# Patient Record
Sex: Male | Born: 1955 | Race: White | Hispanic: No | Marital: Married | Smoking: Light tobacco smoker
Health system: Southern US, Community
[De-identification: ages and names within clinical notes are randomized; demographics above are authoritative.]

## PROBLEM LIST (undated history)

## (undated) DIAGNOSIS — I2510 Atherosclerotic heart disease of native coronary artery without angina pectoris: Secondary | ICD-10-CM

## (undated) DIAGNOSIS — G4733 Obstructive sleep apnea (adult) (pediatric): Secondary | ICD-10-CM

## (undated) DIAGNOSIS — E7800 Pure hypercholesterolemia, unspecified: Secondary | ICD-10-CM

## (undated) DIAGNOSIS — I219 Acute myocardial infarction, unspecified: Secondary | ICD-10-CM

## (undated) DIAGNOSIS — I10 Essential (primary) hypertension: Secondary | ICD-10-CM

## (undated) HISTORY — PX: CARDIAC CATHETERIZATION: SHX172

## (undated) HISTORY — PX: CATARACT EXTRACTION, BILATERAL: SHX1313

## (undated) HISTORY — DX: Obstructive sleep apnea (adult) (pediatric): G47.33

---

## 2004-05-16 ENCOUNTER — Emergency Department (HOSPITAL_COMMUNITY): Admission: EM | Admit: 2004-05-16 | Discharge: 2004-05-16 | Payer: Self-pay | Admitting: Emergency Medicine

## 2005-01-16 ENCOUNTER — Ambulatory Visit: Payer: Self-pay | Admitting: *Deleted

## 2005-02-13 ENCOUNTER — Ambulatory Visit: Payer: Self-pay | Admitting: *Deleted

## 2005-02-13 ENCOUNTER — Ambulatory Visit (HOSPITAL_COMMUNITY): Admission: RE | Admit: 2005-02-13 | Discharge: 2005-02-13 | Payer: Self-pay | Admitting: *Deleted

## 2005-02-17 ENCOUNTER — Ambulatory Visit: Payer: Self-pay | Admitting: Internal Medicine

## 2005-07-16 ENCOUNTER — Ambulatory Visit (HOSPITAL_COMMUNITY): Admission: RE | Admit: 2005-07-16 | Discharge: 2005-07-16 | Payer: Self-pay | Admitting: Family Medicine

## 2009-02-26 ENCOUNTER — Encounter: Payer: Self-pay | Admitting: Cardiology

## 2009-10-13 ENCOUNTER — Encounter: Payer: Self-pay | Admitting: Emergency Medicine

## 2009-10-13 ENCOUNTER — Ambulatory Visit: Payer: Self-pay | Admitting: Cardiovascular Disease

## 2009-10-13 ENCOUNTER — Inpatient Hospital Stay (HOSPITAL_COMMUNITY): Admission: EM | Admit: 2009-10-13 | Discharge: 2009-10-15 | Payer: Self-pay | Admitting: Interventional Cardiology

## 2010-09-21 ENCOUNTER — Encounter: Payer: Self-pay | Admitting: *Deleted

## 2010-11-20 LAB — CBC
HCT: 43.2 % (ref 39.0–52.0)
HCT: 45.3 % (ref 39.0–52.0)
HCT: 46.3 % (ref 39.0–52.0)
HCT: 46.8 % (ref 39.0–52.0)
Hemoglobin: 15 g/dL (ref 13.0–17.0)
Hemoglobin: 15.9 g/dL (ref 13.0–17.0)
Hemoglobin: 16.1 g/dL (ref 13.0–17.0)
Hemoglobin: 16.3 g/dL (ref 13.0–17.0)
MCHC: 34.7 g/dL (ref 30.0–36.0)
MCHC: 34.9 g/dL (ref 30.0–36.0)
MCHC: 34.9 g/dL (ref 30.0–36.0)
MCHC: 35 g/dL (ref 30.0–36.0)
MCV: 89.7 fL (ref 78.0–100.0)
MCV: 91.5 fL (ref 78.0–100.0)
MCV: 91.7 fL (ref 78.0–100.0)
MCV: 91.8 fL (ref 78.0–100.0)
Platelets: 171 10*3/uL (ref 150–400)
Platelets: 184 10*3/uL (ref 150–400)
Platelets: 185 10*3/uL (ref 150–400)
Platelets: 203 10*3/uL (ref 150–400)
RBC: 4.72 MIL/uL (ref 4.22–5.81)
RBC: 4.95 MIL/uL (ref 4.22–5.81)
RBC: 5.04 MIL/uL (ref 4.22–5.81)
RBC: 5.22 MIL/uL (ref 4.22–5.81)
RDW: 13.3 % (ref 11.5–15.5)
RDW: 13.4 % (ref 11.5–15.5)
RDW: 13.6 % (ref 11.5–15.5)
RDW: 13.6 % (ref 11.5–15.5)
WBC: 10.6 10*3/uL — ABNORMAL HIGH (ref 4.0–10.5)
WBC: 8.8 10*3/uL (ref 4.0–10.5)
WBC: 9 10*3/uL (ref 4.0–10.5)
WBC: 9.2 10*3/uL (ref 4.0–10.5)

## 2010-11-20 LAB — COMPREHENSIVE METABOLIC PANEL
ALT: 26 U/L (ref 0–53)
AST: 19 U/L (ref 0–37)
Albumin: 4 g/dL (ref 3.5–5.2)
Alkaline Phosphatase: 46 U/L (ref 39–117)
BUN: 10 mg/dL (ref 6–23)
CO2: 26 mEq/L (ref 19–32)
Calcium: 9.3 mg/dL (ref 8.4–10.5)
Chloride: 104 mEq/L (ref 96–112)
Creatinine, Ser: 1.08 mg/dL (ref 0.4–1.5)
GFR calc Af Amer: >60 mL/min (ref >60)
GFR calc non Af Amer: >60 mL/min (ref >60)
Glucose, Bld: 93 mg/dL (ref 70–99)
Potassium: 4 mEq/L (ref 3.5–5.1)
Sodium: 139 mEq/L (ref 135–145)
Total Bilirubin: 0.7 mg/dL (ref 0.3–1.2)
Total Protein: 7 g/dL (ref 6.0–8.3)

## 2010-11-20 LAB — BASIC METABOLIC PANEL
BUN: 8 mg/dL (ref 6–23)
BUN: 9 mg/dL (ref 6–23)
CO2: 25 mEq/L (ref 19–32)
CO2: 26 mEq/L (ref 19–32)
Calcium: 8.5 mg/dL (ref 8.4–10.5)
Calcium: 8.7 mg/dL (ref 8.4–10.5)
Chloride: 105 mEq/L (ref 96–112)
Chloride: 107 mEq/L (ref 96–112)
Creatinine, Ser: 0.97 mg/dL (ref 0.4–1.5)
Creatinine, Ser: 1.02 mg/dL (ref 0.4–1.5)
GFR calc Af Amer: >60 mL/min (ref >60)
GFR calc Af Amer: >60 mL/min (ref >60)
GFR calc non Af Amer: >60 mL/min (ref >60)
GFR calc non Af Amer: >60 mL/min (ref >60)
Glucose, Bld: 104 mg/dL — ABNORMAL HIGH (ref 70–99)
Glucose, Bld: 90 mg/dL (ref 70–99)
Potassium: 3.6 mEq/L (ref 3.5–5.1)
Potassium: 3.7 mEq/L (ref 3.5–5.1)
Sodium: 135 mEq/L (ref 135–145)
Sodium: 138 mEq/L (ref 135–145)

## 2010-11-20 LAB — CARDIAC PANEL(CRET KIN+CKTOT+MB+TROPI)
CK, MB: 44.1 ng/mL (ref 0.3–4.0)
CK, MB: 62 ng/mL (ref 0.3–4.0)
CK, MB: 68.2 ng/mL (ref 0.3–4.0)
Relative Index: 11.5 — ABNORMAL HIGH (ref 0.0–2.5)
Relative Index: 12.9 — ABNORMAL HIGH (ref 0.0–2.5)
Relative Index: 13.6 — ABNORMAL HIGH (ref 0.0–2.5)
Total CK: 383 U/L — ABNORMAL HIGH (ref 7–232)
Total CK: 481 U/L — ABNORMAL HIGH (ref 7–232)
Total CK: 501 U/L — ABNORMAL HIGH (ref 7–232)
Troponin I: 11.1 ng/mL (ref 0.00–0.06)
Troponin I: 7.53 ng/mL (ref 0.00–0.06)
Troponin I: 9.65 ng/mL (ref 0.00–0.06)

## 2010-11-20 LAB — LIPID PANEL
Cholesterol: 176 mg/dL (ref 0–200)
Cholesterol: 178 mg/dL (ref 0–200)
HDL: 28 mg/dL — ABNORMAL LOW (ref >39)
HDL: 29 mg/dL — ABNORMAL LOW (ref >39)
LDL Cholesterol: 100 mg/dL — ABNORMAL HIGH (ref 0–99)
LDL Cholesterol: 97 mg/dL (ref 0–99)
Total CHOL/HDL Ratio: 6.1 RATIO
Total CHOL/HDL Ratio: 6.4 RATIO
Triglycerides: 248 mg/dL — ABNORMAL HIGH (ref <150)
Triglycerides: 249 mg/dL — ABNORMAL HIGH (ref <150)
VLDL: 50 mg/dL — ABNORMAL HIGH (ref 0–40)
VLDL: 50 mg/dL — ABNORMAL HIGH (ref 0–40)

## 2010-11-20 LAB — DIFFERENTIAL
Basophils Absolute: 0 10*3/uL (ref 0.0–0.1)
Basophils Relative: 0 % (ref 0–1)
Eosinophils Absolute: 0.2 10*3/uL (ref 0.0–0.7)
Eosinophils Relative: 2 % (ref 0–5)
Lymphocytes Relative: 23 % (ref 12–46)
Lymphs Abs: 2 10*3/uL (ref 0.7–4.0)
Monocytes Absolute: 0.5 10*3/uL (ref 0.1–1.0)
Monocytes Relative: 6 % (ref 3–12)
Neutro Abs: 6 10*3/uL (ref 1.7–7.7)
Neutrophils Relative %: 68 % (ref 43–77)

## 2010-11-20 LAB — POCT CARDIAC MARKERS
CKMB, poc: 1.6 ng/mL (ref 1.0–8.0)
CKMB, poc: 9.2 ng/mL (ref 1.0–8.0)
Myoglobin, poc: 171 ng/mL (ref 12–200)
Myoglobin, poc: 234 ng/mL (ref 12–200)
Troponin i, poc: 0.56 ng/mL (ref 0.00–0.09)
Troponin i, poc: <0.05 ng/mL (ref 0.00–0.09)

## 2010-11-20 LAB — PROTIME-INR
INR: 1.03 (ref 0.00–1.49)
Prothrombin Time: 13.4 seconds (ref 11.6–15.2)

## 2010-11-20 LAB — MRSA PCR SCREENING: MRSA by PCR: NEGATIVE

## 2010-11-20 LAB — HEPARIN LEVEL (UNFRACTIONATED)
Heparin Unfractionated: 0.16 IU/mL — ABNORMAL LOW (ref 0.30–0.70)
Heparin Unfractionated: 0.17 IU/mL — ABNORMAL LOW (ref 0.30–0.70)

## 2010-11-20 LAB — D-DIMER, QUANTITATIVE: D-Dimer, Quant: 0.22 ug/mL-FEU (ref 0.00–0.48)

## 2010-11-20 LAB — MAGNESIUM: Magnesium: 1.8 mg/dL (ref 1.5–2.5)

## 2010-11-20 LAB — CK TOTAL AND CKMB (NOT AT ARMC): Relative Index: 4.9 — ABNORMAL HIGH (ref 0.0–2.5)

## 2010-11-20 LAB — LIPASE, BLOOD: Lipase: 37 U/L (ref 11–59)

## 2011-01-16 NOTE — Consult Note (Signed)
NAME:  [name], [name]               ACCOUNT NO.:  [id number]   MEDICAL RECORD NO.:  [id number]         PATIENT TYPE:  AMB   LOCATION:                                FACILITY:  [facility]   PHYSICIAN:  R. Roetta Sessions, M.D. DATE OF BIRTH:  [birth year]   DATE OF CONSULTATION:  02/17/2005  DATE OF DISCHARGE:                                   CONSULTATION   PHYSICIAN REQUESTING CONSULTATION:  Corrie Mckusick, M.D.   REASON FOR CONSULTATION:  Constipation, colonoscopy.   HISTORY OF PRESENT ILLNESS:  [name] [name] is a 55 year old Caucasian gentleman who  presents today for further evaluation of constipation at the request of Dr.  Assunta Found.  He states he typically would have one bowel movement daily.  However, four months ago he noted a change in his bowel movements.  He is  now having bowel movements irregularly, sometimes once every three days or  so.  He has had some bright red blood per rectum primarily on the toilet  tissue.  He feels like he has a hemorrhoid.  He felt like it might be due to  Lexapro which he was taking.  About a month ago, this was switched to  Wellbutrin, and he has noted some improvement in his bowel movements.  He is  trying to stop smoking.  He denies any nausea or vomiting, heartburn.  Occasionally has some epigastric burning which does not last very long.  He  rarely takes anything for this.  He has had some increased abdominal  bloating which he feels is from drinking lots of fluid during the day.  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  He works at [employer] and tries  to stay hydrated.  He had blood work last month which he states was  unremarkable.  We did not receive any of these records.  He has had a stress  test done last week and follows up next week for results.   CURRENT MEDICATIONS:  1.  Wellbutrin 150 mg daily.  2.  Aspirin 325 mg daily.  3.  Motrin p.r.n.   ALLERGIES:  No known drug allergies.   PAST MEDICAL HISTORY:  Anxiety/depression.   PAST SURGICAL HISTORY:  Left cataract extraction.   FAMILY HISTORY:  Father had lung/brain cancer.  No family history of  colorectal cancer.   SOCIAL HISTORY:  Married and has a 55-year-old son.  He works for [employer].  He smokes one pack of cigarettes daily and has been smoking  for 25+ years.  He stopped one time for 18 months.  He has a history of  heavy alcohol use in the past, up to six to twelve-pack of beer daily.  He  stopped on August 22, 2004, and has not drunk anything since.   REVIEW OF SYSTEMS:  See HPI for GI.  CARDIOPULMONARY:  Denies any chest pain  or shortness of breath.   PHYSICAL EXAMINATION:  VITAL SIGNS:  Weight 210, height 5 feet 9 inches,  temperature 98.1, blood pressure 124/68, pulse 68.  GENERAL:  Pleasant, well-nourished, well-developed, Caucasian male  in no  acute distress.  SKIN:  Warm and dry, no jaundice.  HEENT:  Pupils equal, round, and reactive to light.  Conjunctivae pink.  Sclerae nonicteric.  Oropharyngeal mucosa moist and pink.  No lesion,  erythema, or exudate.  NECK:  No lymphadenopathy, thyromegaly.  CHEST:  Lungs clear to auscultation.  CARDIAC:  Regular rate and rhythm.  Normal S1 and S2.  No murmurs, rubs, or  gallops.  ABDOMEN:  Positive bowel sounds. Soft, nondistended, nontender.  No  organomegaly or masses. No rebound tenderness or guarding.  EXTREMITIES:  No edema.   IMPRESSION:  [name] [name] is a 55 year old Caucasian gentleman who presents today  for further evaluation of change in bowel movements and hematochezia.  About  four months ago, he began having constipation which may have been related to  his taking Lexapro.  He is interested in colonoscopy for diagnostic  purposes, and this is reasonable at this time.  I have discussed risks,  alternatives, and benefits with the patient, and he is agreeable to proceed.  At this time, he does not feel like he needs any treatment for constipation  as this has improved with  switching Lexapro to Wellbutrin.  He will let us  know if he would like [place] to trial something at a later date.   PLAN:  1.  Colonoscopy in the near future.  2.  He is advised to maintain hydration while working out in the heat.  3.  Encouraged continued alcohol cessation as well as to try to quit      smoking.       LL/MEDQ  D:  02/17/2005  T:  02/17/2005  Job:  [id number]   cc:   Corrie Mckusick, M.D.  Fax: [phone]

## 2011-01-16 NOTE — Procedures (Signed)
NAME:  [name] [name], [name] [name] NO.:  [id number]   MEDICAL RECORD NO.:  [id number]          PATIENT TYPE:  REC   LOCATION:                                FACILITY:  [facility]   PHYSICIAN:  Vida Roller, M.D.   DATE OF BIRTH:  August 09, 1956   DATE OF PROCEDURE:  DATE OF DISCHARGE:                                    STRESS TEST   HISTORY:  The patient is a 55 year old gentleman with no known coronary  disease who has atypical chest discomfort. His cardiac risk factors include  tobacco use ________________.   BASELINE DATA:  Electrocardiogram reveals sinus rhythm at 67 beats a minute.  Blood pressure is 118/70.   Patient exercised for a total of 12 minutes and 18 seconds, Bruce protocol  stage IV and 12.8 minutes. Maximal heart rate was 148 beats per minute which  is 86% of predicted maximum. Maximum blood pressure was 178/62 and resolved  down to 138/70 in recovery. Electrocardiogram revealed no ischemic changes,  and no arrhythmias were noted.   The patient denies any chest discomfort or shortness of breath with  exercise.   Final images and results are pending M.D. review.       AB/MEDQ  D:  02/13/2005  T:  02/13/2005  Job:  [id number]

## 2011-10-15 HISTORY — PX: CORONARY ANGIOPLASTY: SHX604

## 2012-01-05 ENCOUNTER — Other Ambulatory Visit (HOSPITAL_COMMUNITY): Payer: Self-pay

## 2012-01-07 NOTE — Patient Instructions (Signed)
20 [name] [name] ZOXWRUE  01/07/2012   Your procedure is scheduled on:  Monday, 01/11/12  Report to [facility] at 1130 AM.  Call this number if you have problems the morning of surgery: [phone]   Remember:   Do not eat food:After Midnight.  May have clear liquids:until Midnight .  Clear liquids include soda, tea, black coffee, apple or grape juice, broth.  Take these medicines the morning of surgery with A SIP OF WATER: metoprolol   Do not wear jewelry, make-up or nail polish.  Do not wear lotions, powders, or perfumes. You may wear deodorant.  Do not shave 48 hours prior to surgery.  Do not bring valuables to the hospital.  Contacts, dentures or bridgework may not be worn into surgery.  Leave suitcase in the car. After surgery it may be brought to your room.  For patients admitted to the hospital, checkout time is 11:00 AM the day of discharge.   Patients discharged the day of surgery will not be allowed to drive home.  Name and phone number of your driver: driver  Special Instructions: Use eye drops as directed.   Please read over the following fact sheets that you were given: Pain Booklet, Anesthesia Post-op Instructions and Care and Recovery After Surgery

## 2012-01-08 ENCOUNTER — Encounter (HOSPITAL_COMMUNITY)
Admission: RE | Admit: 2012-01-08 | Discharge: 2012-01-08 | Disposition: A | Discharge status: Home / Self Care | Payer: 59 | Source: Ambulatory Visit | Attending: Ophthalmology | Admitting: Ophthalmology

## 2012-01-08 ENCOUNTER — Encounter (HOSPITAL_COMMUNITY): Payer: Self-pay | Admitting: Pharmacy Technician

## 2012-01-08 ENCOUNTER — Encounter (HOSPITAL_COMMUNITY): Payer: Self-pay

## 2012-01-08 ENCOUNTER — Other Ambulatory Visit: Payer: Self-pay

## 2012-01-08 HISTORY — DX: Atherosclerotic heart disease of native coronary artery without angina pectoris: I25.10

## 2012-01-08 HISTORY — DX: Pure hypercholesterolemia, unspecified: E78.00

## 2012-01-08 HISTORY — DX: Acute myocardial infarction, unspecified: I21.9

## 2012-01-08 HISTORY — DX: Essential (primary) hypertension: I10

## 2012-01-08 LAB — BASIC METABOLIC PANEL
CO2: 24 mEq/L (ref 19–32)
Calcium: 9.8 mg/dL (ref 8.4–10.5)
Creatinine, Ser: 0.97 mg/dL (ref 0.50–1.35)

## 2012-01-08 LAB — HEMOGLOBIN AND HEMATOCRIT, BLOOD: Hemoglobin: 15.4 g/dL (ref 13.0–17.0)

## 2012-01-08 MED ORDER — LIDOCAINE HCL 3.5 % OP GEL
OPHTHALMIC | Status: AC
  Filled 2012-01-08: qty 5

## 2012-01-08 MED ORDER — NEOMYCIN-POLYMYXIN-DEXAMETH 3.5-10000-0.1 OP OINT
TOPICAL_OINTMENT | OPHTHALMIC | Status: AC
  Filled 2012-01-08: qty 3.5

## 2012-01-08 MED ORDER — PHENYLEPHRINE HCL 2.5 % OP SOLN
OPHTHALMIC | Status: AC
  Filled 2012-01-08: qty 2

## 2012-01-08 MED ORDER — CYCLOPENTOLATE-PHENYLEPHRINE 0.2-1 % OP SOLN
OPHTHALMIC | Status: AC
  Filled 2012-01-08: qty 2

## 2012-01-08 MED ORDER — TETRACAINE HCL 0.5 % OP SOLN
OPHTHALMIC | Status: AC
  Filled 2012-01-08: qty 2

## 2012-01-08 NOTE — Progress Notes (Signed)
01/08/12 0840  OBSTRUCTIVE SLEEP APNEA  Have you ever been diagnosed with sleep apnea through a sleep study? No  Do you snore loudly (loud enough to be heard through closed doors)?  0  Do you often feel tired, fatigued, or sleepy during the daytime? 0  Has anyone observed you stop breathing during your sleep? 0  Do you have, or are you being treated for high blood pressure? 1  BMI more than 35 kg/m2? 0  Age over 56 years old? 1  Neck circumference greater than 40 cm/18 inches? 1  Gender: 1  Obstructive Sleep Apnea Score 4   Score 4 or greater  Updated health history;Results sent to PCP

## 2012-01-11 ENCOUNTER — Encounter (HOSPITAL_COMMUNITY): Payer: Self-pay | Admitting: *Deleted

## 2012-01-11 ENCOUNTER — Encounter (HOSPITAL_COMMUNITY): Payer: Self-pay | Admitting: Anesthesiology

## 2012-01-11 ENCOUNTER — Ambulatory Visit (HOSPITAL_COMMUNITY): Payer: 59 | Admitting: Anesthesiology

## 2012-01-11 ENCOUNTER — Ambulatory Visit (HOSPITAL_COMMUNITY)
Admission: RE | Admit: 2012-01-11 | Discharge: 2012-01-11 | Disposition: A | Discharge status: Home / Self Care | Payer: 59 | Source: Ambulatory Visit | Attending: Ophthalmology | Admitting: Ophthalmology

## 2012-01-11 ENCOUNTER — Encounter (HOSPITAL_COMMUNITY)
Admission: RE | Disposition: A | Discharge status: Home / Self Care | Payer: Self-pay | Source: Ambulatory Visit | Attending: Ophthalmology

## 2012-01-11 DIAGNOSIS — Z79899 Other long term (current) drug therapy: Secondary | ICD-10-CM | POA: Insufficient documentation

## 2012-01-11 DIAGNOSIS — T8529XA Other mechanical complication of intraocular lens, initial encounter: Secondary | ICD-10-CM | POA: Insufficient documentation

## 2012-01-11 DIAGNOSIS — Y838 Other surgical procedures as the cause of abnormal reaction of the patient, or of later complication, without mention of misadventure at the time of the procedure: Secondary | ICD-10-CM | POA: Insufficient documentation

## 2012-01-11 DIAGNOSIS — I10 Essential (primary) hypertension: Secondary | ICD-10-CM | POA: Insufficient documentation

## 2012-01-11 DIAGNOSIS — Z01812 Encounter for preprocedural laboratory examination: Secondary | ICD-10-CM | POA: Insufficient documentation

## 2012-01-11 SURGERY — REPOSITIONING, IOL
Anesthesia: Monitor Anesthesia Care | Site: Eye | Laterality: Left | Wound class: Clean

## 2012-01-11 MED ORDER — NA HYALUR & NA CHOND-NA HYALUR 0.55-0.5 ML IO KIT
PACK | INTRAOCULAR | Status: DC | PRN
  Administered 2012-01-11: 1 via OPHTHALMIC

## 2012-01-11 MED ORDER — LIDOCAINE HCL (PF) 2 % IJ SOLN
INTRAMUSCULAR | Status: AC
  Filled 2012-01-11: qty 2

## 2012-01-11 MED ORDER — LIDOCAINE HCL 3.5 % OP GEL: 1.0000 "application " | Freq: Once | OPHTHALMIC | Status: DC

## 2012-01-11 MED ORDER — POVIDONE-IODINE 5 % OP SOLN
OPHTHALMIC | Status: DC | PRN
  Administered 2012-01-11: 1 via OPHTHALMIC

## 2012-01-11 MED ORDER — LIDOCAINE HCL (PF) 2 % IJ SOLN
INTRAMUSCULAR | Status: DC | PRN
  Administered 2012-01-11: 2.5 mL

## 2012-01-11 MED ORDER — EPINEPHRINE HCL 1 MG/ML IJ SOLN
INTRAOCULAR | Status: DC | PRN
  Administered 2012-01-11: 13:00:00

## 2012-01-11 MED ORDER — MIDAZOLAM HCL 2 MG/2ML IJ SOLN
INTRAMUSCULAR | Status: AC
  Filled 2012-01-11: qty 2

## 2012-01-11 MED ORDER — BSS IO SOLN
INTRAOCULAR | Status: DC | PRN
  Administered 2012-01-11: 15 mL via INTRAOCULAR

## 2012-01-11 MED ORDER — CARBACHOL 0.01 % IO SOLN
INTRAOCULAR | Status: DC | PRN
  Administered 2012-01-11: 2 mL via INTRAOCULAR

## 2012-01-11 MED ORDER — EPINEPHRINE HCL 1 MG/ML IJ SOLN
INTRAMUSCULAR | Status: AC
  Filled 2012-01-11: qty 1

## 2012-01-11 MED ORDER — LACTATED RINGERS IV SOLN
INTRAVENOUS | Status: DC
  Administered 2012-01-11: 12:00:00 via INTRAVENOUS

## 2012-01-11 MED ORDER — NEOMYCIN-POLYMYXIN-DEXAMETH 0.1 % OP OINT
TOPICAL_OINTMENT | OPHTHALMIC | Status: DC | PRN
  Administered 2012-01-11: 1 via OPHTHALMIC

## 2012-01-11 MED ORDER — TETRACAINE HCL 0.5 % OP SOLN
1.0000 [drp] | OPHTHALMIC | Status: AC
  Administered 2012-01-11 (×3): 1 [drp] via OPHTHALMIC

## 2012-01-11 MED ORDER — BUPIVACAINE HCL 0.75 % IJ SOLN
INTRAMUSCULAR | Status: DC | PRN
  Administered 2012-01-11: 2.5 mL

## 2012-01-11 MED ORDER — MIDAZOLAM HCL 2 MG/2ML IJ SOLN
1.0000 mg | INTRAMUSCULAR | Status: DC | PRN
  Administered 2012-01-11 (×2): 2 mg via INTRAVENOUS

## 2012-01-11 MED ORDER — CARBACHOL 0.01 % IO SOLN
INTRAOCULAR | Status: AC
  Filled 2012-01-11: qty 1.5

## 2012-01-11 MED ORDER — BUPIVACAINE HCL 0.75 % IJ SOLN
INTRAMUSCULAR | Status: AC
  Filled 2012-01-11: qty 10

## 2012-01-11 MED ORDER — PHENYLEPHRINE HCL 2.5 % OP SOLN
1.0000 [drp] | OPHTHALMIC | Status: AC
  Administered 2012-01-11 (×3): 1 [drp] via OPHTHALMIC

## 2012-01-11 MED ORDER — ONDANSETRON HCL 4 MG/2ML IJ SOLN: 4.0000 mg | Freq: Once | INTRAMUSCULAR | Status: DC | PRN

## 2012-01-11 MED ORDER — FENTANYL CITRATE 0.05 MG/ML IJ SOLN: 25.0000 ug | INTRAMUSCULAR | Status: DC | PRN

## 2012-01-11 MED ORDER — BUPIVACAINE HCL 0.75 % IJ SOLN
INTRAMUSCULAR | Status: DC | PRN
  Administered 2012-01-11: 1 mL

## 2012-01-11 MED ORDER — CYCLOPENTOLATE-PHENYLEPHRINE 0.2-1 % OP SOLN
1.0000 [drp] | OPHTHALMIC | Status: AC
  Administered 2012-01-11 (×3): 1 [drp] via OPHTHALMIC

## 2012-01-11 SURGICAL SUPPLY — 32 items
CAPSULAR TENSION RING-AMO (OPHTHALMIC RELATED) IMPLANT
CLOTH BEACON ORANGE TIMEOUT ST (SAFETY) ×2 IMPLANT
EYE SHIELD UNIVERSAL CLEAR (GAUZE/BANDAGES/DRESSINGS) ×2 IMPLANT
GLOVE BIO SURGEON STRL SZ 6.5 (GLOVE) IMPLANT
GLOVE BIOGEL PI IND STRL 6.5 (GLOVE) IMPLANT
GLOVE BIOGEL PI IND STRL 7.0 (GLOVE) ×1 IMPLANT
GLOVE BIOGEL PI IND STRL 7.5 (GLOVE) IMPLANT
GLOVE BIOGEL PI INDICATOR 6.5 (GLOVE)
GLOVE BIOGEL PI INDICATOR 7.0 (GLOVE) ×1
GLOVE BIOGEL PI INDICATOR 7.5 (GLOVE)
GLOVE ECLIPSE 6.5 STRL STRAW (GLOVE) IMPLANT
GLOVE ECLIPSE 7.0 STRL STRAW (GLOVE) IMPLANT
GLOVE ECLIPSE 7.5 STRL STRAW (GLOVE) IMPLANT
GLOVE EXAM NITRILE LRG STRL (GLOVE) IMPLANT
GLOVE EXAM NITRILE MD LF STRL (GLOVE) ×2 IMPLANT
GLOVE SKINSENSE NS SZ6.5 (GLOVE)
GLOVE SKINSENSE NS SZ7.0 (GLOVE)
GLOVE SKINSENSE STRL SZ6.5 (GLOVE) IMPLANT
GLOVE SKINSENSE STRL SZ7.0 (GLOVE) IMPLANT
KIT VITRECTOMY (OPHTHALMIC RELATED) IMPLANT
PAD ARMBOARD 7.5X6 YLW CONV (MISCELLANEOUS) ×2 IMPLANT
PROC W NO LENS (INTRAOCULAR LENS) ×3
PROC W SPEC LENS (INTRAOCULAR LENS)
PROCESS W NO LENS (INTRAOCULAR LENS) ×1 IMPLANT
PROCESS W SPEC LENS (INTRAOCULAR LENS) IMPLANT
RING MALYGIN (MISCELLANEOUS) IMPLANT
SUT PROLENE 10 0 CIF 4 DA (SUTURE) ×2 IMPLANT
SYR TB 1ML LL NO SAFETY (SYRINGE) ×2 IMPLANT
TAPE SURG TRANSPORE 1 IN (GAUZE/BANDAGES/DRESSINGS) ×1 IMPLANT
TAPE SURGICAL TRANSPORE 1 IN (GAUZE/BANDAGES/DRESSINGS) ×1
VISCOELASTIC ADDITIONAL (OPHTHALMIC RELATED) IMPLANT
WATER STERILE IRR 250ML POUR (IV SOLUTION) ×2 IMPLANT

## 2012-01-11 NOTE — Discharge Instructions (Signed)
[name] [name] [id number]  01/11/2012     Instructions  1. Use medications as Instructed.  Shake well before use. Wait 5 minutes between drops.  {OPHTHALMIC ANTIBIOTICS:22167} 4 times a day x 1 week.  {OPHTHALMIC ANTI-INFLAMMATORY:22168} 2 times a day x 4 weeks.  {OPHTHALMIC STEROID:22169} 4 times a day - week 1   3 times a day - Week 2, 2 times a day- Week 3, 1 time a day - Week 4.  2. Do not rub the operative eye. Do not swim underwater for 2 weeks.  3. You may remove the clear shield and resume your normal activities the day after  Surgery. Your eyes may feel more comfortable if you wear dark glasses outside.  4. Call our office at [phone] if you have sudden change in vision, extreme redness or pain. Some fluctuation in vision is normal after surgery. If you have an emergency after hours, call Dr. Alto Denver at [phone].  5. It is important that you attend all of your follow-up appointments.        Follow-up:{follow up:32580} with Gemma Payor, MD.   Dr. Lahoma Crocker: [phone]  Dr. Lita Mains: [phone]  Dr. Alto Denver: [phone]   If you find that you cannot contact your physician, but feel that your signs and   Symptoms warrant a physician's attention, call the Emergency Room at   [phone] ext.[phone].   Other{NA AND YQIHKVQQ:59563}.

## 2012-01-11 NOTE — Anesthesia Postprocedure Evaluation (Signed)
  Anesthesia Post-op Note  Patient: [name] [name]  Procedure(s) Performed: Procedure(s) (LRB): REPOSITION OF LENS (Left)  Patient Location:  Short Stay  Anesthesia Type: MAC  Level of Consciousness: awake  Airway and Oxygen Therapy: Patient Spontanous Breathing  Post-op Pain: none  Post-op Assessment: Post-op Vital signs reviewed, Patient's Cardiovascular Status Stable, Respiratory Function Stable, Patent Airway, No signs of Nausea or vomiting and Pain level controlled  Post-op Vital Signs: Reviewed and stable  Complications: No apparent anesthesia complications

## 2012-01-11 NOTE — H&P (Signed)
I have reviewed the H&P, the patient was re-examined, and I have identified no interval changes in medical condition and plan of care since the history and physical of record  

## 2012-01-11 NOTE — Brief Op Note (Signed)
Pre-Op Dx: Dislocated IOL OS Post-Op Dx: Dislocated IOL OS Surgeon: Gemma Payor Anesthesia: Topical with MAC Surgery: IOL repositioning and iris fixation. Implant: None Blood Loss: None Specimen: None Complications: None

## 2012-01-11 NOTE — Transfer of Care (Signed)
Immediate Anesthesia Transfer of Care Note  Patient: [name] [name]  Procedure(s) Performed: Procedure(s) (LRB): REPOSITION OF LENS (Left)  Patient Location: Shortstay  Anesthesia Type: MAC  Level of Consciousness: awake  Airway & Oxygen Therapy: Patient Spontanous Breathing   Post-op Assessment: Report given to PACU RN, Post -op Vital signs reviewed and stable and Patient moving all extremities  Post vital signs: Reviewed and stable  Complications: No apparent anesthesia complications

## 2012-01-11 NOTE — Anesthesia Preprocedure Evaluation (Signed)
Anesthesia Evaluation  Patient identified by MRN, date of birth, ID band Patient awake    Reviewed: Allergy & Precautions, H&P , NPO status , Patient's Chart, lab work & pertinent test results, reviewed documented beta blocker date and time   History of Anesthesia Complications Negative for: history of anesthetic complications  Airway Mallampati: II TM Distance: >3 FB     Dental  (+) Teeth Intact   Pulmonary Current Smoker,  breath sounds clear to auscultation        Cardiovascular hypertension, Pt. on medications + CAD, + Past MI and + Cardiac Stents Rhythm:Regular Rate:Bradycardia     Neuro/Psych    GI/Hepatic   Endo/Other    Renal/GU      Musculoskeletal   Abdominal   Peds  Hematology   Anesthesia Other Findings   Reproductive/Obstetrics                           Anesthesia Physical Anesthesia Plan  ASA: III  Anesthesia Plan: MAC   Post-op Pain Management:    Induction: Intravenous  Airway Management Planned: Nasal Cannula  Additional Equipment:   Intra-op Plan:   Post-operative Plan:   Informed Consent: I have reviewed the patients History and Physical, chart, labs and discussed the procedure including the risks, benefits and alternatives for the proposed anesthesia with the patient or authorized representative who has indicated his/her understanding and acceptance.     Plan Discussed with:   Anesthesia Plan Comments:         Anesthesia Quick Evaluation

## 2014-01-16 ENCOUNTER — Encounter: Payer: Self-pay | Admitting: Interventional Cardiology

## 2014-01-16 DIAGNOSIS — E785 Hyperlipidemia, unspecified: Secondary | ICD-10-CM | POA: Insufficient documentation

## 2014-01-16 DIAGNOSIS — Z9861 Coronary angioplasty status: Secondary | ICD-10-CM

## 2014-01-16 DIAGNOSIS — I10 Essential (primary) hypertension: Secondary | ICD-10-CM | POA: Insufficient documentation

## 2014-01-16 DIAGNOSIS — I2510 Atherosclerotic heart disease of native coronary artery without angina pectoris: Secondary | ICD-10-CM | POA: Insufficient documentation

## 2014-01-22 ENCOUNTER — Other Ambulatory Visit: Payer: Self-pay | Admitting: *Deleted

## 2014-01-22 DIAGNOSIS — E785 Hyperlipidemia, unspecified: Secondary | ICD-10-CM

## 2014-02-19 ENCOUNTER — Other Ambulatory Visit: Payer: 59

## 2014-05-11 ENCOUNTER — Telehealth: Payer: Self-pay | Admitting: Interventional Cardiology

## 2014-05-11 MED ORDER — SIMVASTATIN 40 MG PO TABS: 40.0000 mg | ORAL_TABLET | Freq: Every day | ORAL | Status: DC

## 2014-05-11 NOTE — Telephone Encounter (Signed)
Done

## 2014-05-11 NOTE — Telephone Encounter (Signed)
°  Patient needs a new RX for Simvastatin 40mg . Please call into [place] pharmacy.

## 2014-09-15 ENCOUNTER — Other Ambulatory Visit: Payer: Self-pay | Admitting: Interventional Cardiology

## 2014-09-17 NOTE — Telephone Encounter (Signed)
Please advise on refill. Thanks, MI 

## 2015-11-11 ENCOUNTER — Encounter: Payer: Self-pay | Admitting: Interventional Cardiology

## 2015-11-11 ENCOUNTER — Ambulatory Visit (INDEPENDENT_AMBULATORY_CARE_PROVIDER_SITE_OTHER): Payer: BLUE CROSS/BLUE SHIELD | Admitting: Interventional Cardiology

## 2015-11-11 VITALS — BP 126/68 | HR 80 | Ht 69.0 in | Wt 244.6 lb

## 2015-11-11 DIAGNOSIS — G4719 Other hypersomnia: Secondary | ICD-10-CM

## 2015-11-11 DIAGNOSIS — I252 Old myocardial infarction: Secondary | ICD-10-CM | POA: Insufficient documentation

## 2015-11-11 DIAGNOSIS — I214 Non-ST elevation (NSTEMI) myocardial infarction: Secondary | ICD-10-CM

## 2015-11-11 DIAGNOSIS — R0683 Snoring: Secondary | ICD-10-CM | POA: Diagnosis not present

## 2015-11-11 DIAGNOSIS — I2510 Atherosclerotic heart disease of native coronary artery without angina pectoris: Secondary | ICD-10-CM | POA: Diagnosis not present

## 2015-11-11 NOTE — Progress Notes (Signed)
Cardiology Office Note   Date:  11/11/2015   ID:  RHON [name], DOB 1956-03-22, MRN [id number]  PCP:  Colette Ribas, MD  Cardiologist:  Lesleigh Noe, MD   Chief Complaint  Patient presents with  . Coronary Artery Disease      History of Present Illness: [name] [name] is a 60 y.o. male who presents for CAD with bare-metal stent RCA 2011 in setting of non-ST elevation MI, hypertension, obesity, and hyperlipidemia.  I'm not seem a secundum of in several years. He does have coronary disease with prior non-STEMI resulting in bare-metal stent to the RCA in 2011. According to him he has not had any recurrence of chest discomfort. He is concerned about weight gain, exertional fatigue, dyspnea that awakens him from sleep, and occasional chest pressure. The chest pressure is nonexertional. His job is now much more sedentary than previous. He has gained 15 pounds over the past several years.    Past Medical History  Diagnosis Date  . Hypertension   . Myocardial infarction (HCC)     10/14/2009  . Coronary artery disease   . Hypercholesteremia     Past Surgical History  Procedure Laterality Date  . Cardiac catheterization      10/14/2012-with stent  . Coronary angioplasty  10/15/2011    1 stent  . Cataract extraction, bilateral      [facility]     Current Outpatient Prescriptions  Medication Sig Dispense Refill  . aspirin 325 MG tablet Take 325 mg by mouth daily.    . fluticasone (CUTIVATE) 0.05 % cream Apply 1 application topically daily as needed. (rash)  2  . ketoconazole (NIZORAL) 2 % cream Apply 1 application topically daily as needed. (skin rash)  2  . VIAGRA 100 MG tablet Take 100 mg by mouth daily as needed. (ED)  10  . metoprolol succinate (TOPROL-XL) 25 MG 24 hr tablet Take 25 mg by mouth daily. Reported on 11/11/2015    . simvastatin (ZOCOR) 40 MG tablet TAKE ONE (1) TABLET BY MOUTH EVERY DAY (Patient not taking: Reported on 11/11/2015) 15  tablet 0   No current facility-administered medications for this visit.    Allergies:   Review of patient's allergies indicates no known allergies.    Social History:  The patient  reports that he has been smoking Cigarettes.  He has a 15 pack-year smoking history. He has never used smokeless tobacco. He reports that he drinks about 4.2 oz of alcohol per week. He reports that he does not use illicit drugs.   Family History:  The patient's family history includes Cancer in his father; Healthy in his mother. There is no history of Anesthesia problems, Hypotension, Malignant hyperthermia, or Pseudochol deficiency.    ROS:  Please see the history of present illness.   Otherwise, review of systems are positive for continues to smoke cigarettes and not yet ready to commit to stopping. Decreased hearing. Excessive daytime sleepiness, excessive fatigue, occasional chest pressure, snoring, and cough..   All other systems are reviewed and negative.    PHYSICAL EXAM: VS:  BP 126/68 mmHg  Pulse 80  Ht 5\' 9"  (1.753 m)  Wt 244 lb 9.6 oz (110.95 kg)  BMI 36.10 kg/m2 , BMI Body mass index is 36.1 kg/(m^2). GEN: Well nourished, well developed, in no acute distress HEENT: normal Neck: no JVD, carotid bruits, or masses Cardiac: RRR.  There is no murmur, rub, or gallop. There is no edema. Respiratory:  clear to auscultation bilaterally, normal work of breathing. GI: soft, nontender, nondistended, + BS MS: no deformity or atrophy Skin: warm and dry, no rash Neuro:  Strength and sensation are intact Psych: euthymic mood, full affect   EKG:  EKG is ordered today. The ekg reveals normal sinus rhythm   Recent Labs: No results found for requested labs within last 365 days.    Lipid Panel    Component Value Date/Time   CHOL  10/14/2009 0345    178        ATP III CLASSIFICATION:  <200     mg/dL   Desirable  161-096  mg/dL   Borderline High  >=045    mg/dL   High          CHOL  40/98/1191 0345     176        ATP III CLASSIFICATION:  <200     mg/dL   Desirable  478-295  mg/dL   Borderline High  >=621    mg/dL   High          TRIG 308* 10/14/2009 0345   TRIG 248* 10/14/2009 0345   HDL 28* 10/14/2009 0345   HDL 29* 10/14/2009 0345   CHOLHDL 6.4 10/14/2009 0345   CHOLHDL 6.1 10/14/2009 0345   VLDL 50* 10/14/2009 0345   VLDL 50* 10/14/2009 0345   LDLCALC * 10/14/2009 0345    100        Total Cholesterol/HDL:CHD Risk Coronary Heart Disease Risk Table                     Men   Women  1/2 Average Risk   3.4   3.3  Average Risk       5.0   4.4  2 X Average Risk   9.6   7.1  3 X Average Risk  23.4   11.0        Use the calculated Patient Ratio above and the CHD Risk Table to determine the patient's CHD Risk.        ATP III CLASSIFICATION (LDL):  <100     mg/dL   Optimal  657-846  mg/dL   Near or Above                    Optimal  130-159  mg/dL   Borderline  962-952  mg/dL   High  >841     mg/dL   Very High   LDLCALC  10/14/2009 0345    97        Total Cholesterol/HDL:CHD Risk Coronary Heart Disease Risk Table                     Men   Women  1/2 Average Risk   3.4   3.3  Average Risk       5.0   4.4  2 X Average Risk   9.6   7.1  3 X Average Risk  23.4   11.0        Use the calculated Patient Ratio above and the CHD Risk Table to determine the patient's CHD Risk.        ATP III CLASSIFICATION (LDL):  <100     mg/dL   Optimal  324-401  mg/dL   Near or Above                    Optimal  130-159  mg/dL   Borderline  160-189  mg/dL   High  >161     mg/dL   Very High      Wt Readings from Last 3 Encounters:  11/11/15 244 lb 9.6 oz (110.95 kg)  01/08/12 221 lb (100.245 kg)      Other studies Reviewed: Additional studies/ records that were reviewed today include: None. The findings include reviewed last records from [facility]. Excess treadmill test was last performed in August 2014. No evidence of ischemia was noted. Does have bare-metal stent and  RCA..    ASSESSMENT AND PLAN:  1. Coronary artery disease involving native coronary artery of native heart without angina pectoris Asymptomatic. Chest discomfort is atypical and probably not related to ischemia although he has gained significant weight and is significantly more sedentary. He has also stopped statin therapy and no longer uses the beta blocker. - EKG 12-Lead - Exercise Tolerance Test; Future - Lipid panel; Future  2. Non-ST elevation MI (NSTEMI) (HCC) No recurrence  3. Snoring According to the patient is wife complains of his snoring and also spells of apnea.  4. Excessive daytime sleepiness Likely related to sleep apnea.    Current medicines are reviewed at length with the patient today.  The patient has the following concerns regarding medicines: none. He has stopped medications on his own..  The following changes/actions have been instituted:    Discussed smoking cessation. He is unwilling to commit to cessation at this point.  Sleep study to rule out obstructive sleep apnea which seems highly likely.  Stress test to rule out myocardial ischemia especially given bare-metal stent in the right coronary  Morbid obesity needs to be managed with increased aerobic activity, decreased caloric intake, and management of sleep apnea.  Labs/ tests ordered today include:  No orders of the defined types were placed in this encounter.     Disposition:   FU with HS in 1 months  Signed, Lesleigh Noe, MD  11/11/2015 9:45 AM    [facility] [address] [place], [place], [place]  [address] Phone: [phone]; Fax: [phone]

## 2015-11-11 NOTE — Patient Instructions (Signed)
Medication Instructions:  Your physician recommends that you continue on your current medications as directed. Please refer to the Current Medication list given to you today.   Labwork: Your physician recommends that you return for a FASTING lipid profile the same day as your stress test   Testing/Procedures: Your physician has requested that you have an exercise tolerance test. For further information please visit [email or web address] Please also follow instruction sheet, as given.  Your physician has recommended that you have a sleep study. This test records several body functions during sleep, including: brain activity, eye movement, oxygen and carbon dioxide blood levels, heart rate and rhythm, breathing rate and rhythm, the flow of air through your mouth and nose, snoring, body muscle movements, and chest and belly movement.   Follow-Up: Your physician recommends that you schedule a follow-up appointment pending test results   Any Other Special Instructions Will Be Listed Below (If Applicable).     If you need a refill on your cardiac medications before your next appointment, please call your pharmacy.

## 2015-11-25 ENCOUNTER — Ambulatory Visit (INDEPENDENT_AMBULATORY_CARE_PROVIDER_SITE_OTHER): Payer: BLUE CROSS/BLUE SHIELD

## 2015-11-25 ENCOUNTER — Other Ambulatory Visit (INDEPENDENT_AMBULATORY_CARE_PROVIDER_SITE_OTHER): Payer: BLUE CROSS/BLUE SHIELD | Admitting: *Deleted

## 2015-11-25 ENCOUNTER — Other Ambulatory Visit: Payer: Self-pay | Admitting: Interventional Cardiology

## 2015-11-25 DIAGNOSIS — I2510 Atherosclerotic heart disease of native coronary artery without angina pectoris: Secondary | ICD-10-CM

## 2015-11-25 DIAGNOSIS — I214 Non-ST elevation (NSTEMI) myocardial infarction: Secondary | ICD-10-CM

## 2015-11-25 DIAGNOSIS — R0789 Other chest pain: Secondary | ICD-10-CM

## 2015-11-25 LAB — LIPID PANEL
CHOLESTEROL: 186 mg/dL (ref 125–200)
HDL: 19 mg/dL — ABNORMAL LOW (ref >=40)
Total CHOL/HDL Ratio: 9.8 Ratio — ABNORMAL HIGH (ref <=5.0)
Triglycerides: 562 mg/dL — ABNORMAL HIGH (ref <150)

## 2015-11-25 LAB — EXERCISE TOLERANCE TEST
CHL CUP MPHR: 161 {beats}/min
CHL CUP RESTING HR STRESS: 96 {beats}/min
CHL RATE OF PERCEIVED EXERTION: 19
CSEPED: 8 min
Estimated workload: 10.1 METS
Peak HR: 125 {beats}/min
Percent HR: 78 %

## 2015-11-28 ENCOUNTER — Other Ambulatory Visit: Payer: Self-pay | Admitting: Interventional Cardiology

## 2015-12-16 ENCOUNTER — Telehealth: Payer: Self-pay | Admitting: Interventional Cardiology

## 2015-12-16 DIAGNOSIS — E785 Hyperlipidemia, unspecified: Secondary | ICD-10-CM

## 2015-12-16 NOTE — Telephone Encounter (Signed)
Pt returned call to lisa-pls call

## 2015-12-17 NOTE — Telephone Encounter (Signed)
-----   Message from Lyn Records, MD sent at 11/28/2015  7:11 PM EDT ----- No evidence of blocked artery to the level of exercise achieved. This is reassuring.

## 2015-12-17 NOTE — Telephone Encounter (Signed)
3rd attempt to reach pt. Called to give pt lab and gxt results. lmtcb

## 2015-12-18 NOTE — Telephone Encounter (Signed)
-----   Message from Lyn Records, MD sent at 11/28/2015  6:16 PM EDT ----- Triglycerides are markedly elevated.  Total cholesterol is okay.  His he drinking alcohol? If so he needs to decrease and/or discontinue to allow triglycerides to improve. Start TriCor 48 mg daily. Repeat lipid panel and Cmet in one month.

## 2015-12-18 NOTE — Telephone Encounter (Signed)
Pt is requesting an Rx for Viagra. Encouraged pt to contact his pcp. Pt sts that he my be switching providers and would like to know if Dr.Smith can write the Rx. Adv pt that i will fwd his rqst to Dr.Smith and call back with his response. Pt verbalized understanding.

## 2015-12-20 NOTE — Telephone Encounter (Signed)
Okay to use. This should be handled by PCP. Okay to give onetime prescription for 50 mg, #10

## 2015-12-23 MED ORDER — SILDENAFIL CITRATE 50 MG PO TABS: 50.0000 mg | ORAL_TABLET | Freq: Every day | ORAL | Status: DC | PRN

## 2015-12-23 MED ORDER — FENOFIBRATE 48 MG PO TABS: 48.0000 mg | ORAL_TABLET | Freq: Every day | ORAL | Status: DC

## 2015-12-23 NOTE — Telephone Encounter (Signed)
Pt aware of Dr.Smith's response Okay to use. This should be handled by PCP. Okay to give onetime prescription for 50 mg, #10 Pt needs to start Tricor 48mg  qd,  because of triglycerides. Both Rx sent to pt's pharmacy. Fasting lab appt scheduled for 5/22. Pt aware and verbalized understanding.

## 2015-12-23 NOTE — Telephone Encounter (Signed)
Follow up Call.    Mr. [name] [name] is calling to follow up on his call with you about his prescription .Marland Kitchen Please call on cell phone [phone]   Thanks

## 2016-01-20 ENCOUNTER — Other Ambulatory Visit (INDEPENDENT_AMBULATORY_CARE_PROVIDER_SITE_OTHER): Payer: BLUE CROSS/BLUE SHIELD

## 2016-01-20 DIAGNOSIS — E785 Hyperlipidemia, unspecified: Secondary | ICD-10-CM

## 2016-01-20 LAB — LIPID PANEL
CHOL/HDL RATIO: 8.3 ratio — AB (ref <=5.0)
CHOLESTEROL: 192 mg/dL (ref 125–200)
HDL: 23 mg/dL — ABNORMAL LOW (ref >=40)
LDL Cholesterol: 110 mg/dL (ref <130)
Triglycerides: 294 mg/dL — ABNORMAL HIGH (ref <150)
VLDL: 59 mg/dL — ABNORMAL HIGH (ref <30)

## 2016-01-20 LAB — COMPREHENSIVE METABOLIC PANEL
ALBUMIN: 4.4 g/dL (ref 3.6–5.1)
ALK PHOS: 51 U/L (ref 40–115)
ALT: 24 U/L (ref 9–46)
AST: 19 U/L (ref 10–35)
BILIRUBIN TOTAL: 0.5 mg/dL (ref 0.2–1.2)
BUN: 12 mg/dL (ref 7–25)
CALCIUM: 9.4 mg/dL (ref 8.6–10.3)
CO2: 27 mmol/L (ref 20–31)
Chloride: 105 mmol/L (ref 98–110)
Creat: 1.11 mg/dL (ref 0.70–1.33)
Glucose, Bld: 108 mg/dL — ABNORMAL HIGH (ref 65–99)
Potassium: 4.8 mmol/L (ref 3.5–5.3)
Sodium: 140 mmol/L (ref 135–146)
Total Protein: 6.8 g/dL (ref 6.1–8.1)

## 2016-07-09 ENCOUNTER — Ambulatory Visit (INDEPENDENT_AMBULATORY_CARE_PROVIDER_SITE_OTHER): Payer: BLUE CROSS/BLUE SHIELD | Admitting: Otolaryngology

## 2016-07-09 DIAGNOSIS — H903 Sensorineural hearing loss, bilateral: Secondary | ICD-10-CM

## 2016-07-09 DIAGNOSIS — J310 Chronic rhinitis: Secondary | ICD-10-CM

## 2016-07-09 DIAGNOSIS — J343 Hypertrophy of nasal turbinates: Secondary | ICD-10-CM | POA: Diagnosis not present

## 2016-07-09 DIAGNOSIS — H6981 Other specified disorders of Eustachian tube, right ear: Secondary | ICD-10-CM | POA: Diagnosis not present

## 2016-08-13 ENCOUNTER — Ambulatory Visit (INDEPENDENT_AMBULATORY_CARE_PROVIDER_SITE_OTHER): Payer: BLUE CROSS/BLUE SHIELD | Admitting: Otolaryngology

## 2016-08-13 DIAGNOSIS — J310 Chronic rhinitis: Secondary | ICD-10-CM | POA: Diagnosis not present

## 2016-08-13 DIAGNOSIS — H9011 Conductive hearing loss, unilateral, right ear, with unrestricted hearing on the contralateral side: Secondary | ICD-10-CM | POA: Diagnosis not present

## 2016-08-13 DIAGNOSIS — H6981 Other specified disorders of Eustachian tube, right ear: Secondary | ICD-10-CM | POA: Diagnosis not present

## 2016-09-21 ENCOUNTER — Telehealth: Payer: Self-pay | Admitting: Interventional Cardiology

## 2016-09-21 NOTE — Telephone Encounter (Signed)
Pt has been having SOB that comes and goes for about a month now.  Occurs whether he is sitting still or up moving around.  Denies any other cardiac sx.  States he has been seeing ENT due to severe congestion/head cold that wont resolve.  They have given him a steroid nose spray to see if this will help open nasal passages back up.  Seen PCP today and they suggested he be seen by Cardiology just in case.  BP at PCP was 122/78.  Pt states wife has told him that during the night she hears him gasp for air occasionally.    Advised pt to keep appt with Tereso Newcomer, PA-C on 10/05/16 and that I would call him if Dr. Katrinka Blazing had any further recommendations.

## 2016-09-21 NOTE — Telephone Encounter (Signed)
Pt c/o Shortness Of Breath: STAT if SOB developed within the last 24 hours or pt is noticeably SOB on the phone  1. Are you currently SOB (can you hear that pt is SOB on the phone)? no  2. How long have you been experiencing SOB? 1 month  3. Are you SOB when sitting or when up moving around? both  4. Are you currently experiencing any other symptoms? No other symptoms noted

## 2016-09-22 NOTE — Telephone Encounter (Signed)
Appropriate.

## 2016-10-05 ENCOUNTER — Ambulatory Visit (INDEPENDENT_AMBULATORY_CARE_PROVIDER_SITE_OTHER): Payer: BLUE CROSS/BLUE SHIELD | Admitting: Physician Assistant

## 2016-10-05 ENCOUNTER — Encounter: Payer: Self-pay | Admitting: Physician Assistant

## 2016-10-05 ENCOUNTER — Encounter (INDEPENDENT_AMBULATORY_CARE_PROVIDER_SITE_OTHER): Payer: Self-pay

## 2016-10-05 VITALS — BP 124/74 | HR 87 | Ht 69.0 in | Wt 254.0 lb

## 2016-10-05 DIAGNOSIS — I2510 Atherosclerotic heart disease of native coronary artery without angina pectoris: Secondary | ICD-10-CM | POA: Diagnosis not present

## 2016-10-05 DIAGNOSIS — R0602 Shortness of breath: Secondary | ICD-10-CM | POA: Diagnosis not present

## 2016-10-05 DIAGNOSIS — Z720 Tobacco use: Secondary | ICD-10-CM | POA: Diagnosis not present

## 2016-10-05 DIAGNOSIS — E7800 Pure hypercholesterolemia, unspecified: Secondary | ICD-10-CM | POA: Diagnosis not present

## 2016-10-05 MED ORDER — ATORVASTATIN CALCIUM 20 MG PO TABS: 20.0000 mg | ORAL_TABLET | Freq: Every day | ORAL | 3 refills | Status: DC

## 2016-10-05 NOTE — Progress Notes (Signed)
Cardiology Office Note:    Date:  10/05/2016   ID:  [name] [name], DOB 28-Jul-1956, MRN [id number]  PCP:  Colette Ribas, MD  Cardiologist:  Dr. Verdis Prime   Electrophysiologist:  n/a  Referring MD: Assunta Found, MD   Chief Complaint  Patient presents with  . Shortness of Breath    History of Present Illness:    [name] [name] is a 61 y.o. male with a hx of CAD s/p NSTEMI in 2011 tx with BMS to the RCA, HTN, HL, obesity.  Last seen by Dr. Verdis Prime in 3/17.  He had a low risk ETT in 3/17 (did not achieve 85% PMHR).    He called in recently with complaints of shortness of breath. He is added for evaluation.  He has had issues with cough and congestion for several weeks.  In that time, his wife noted that he was struggling to breath at night.  He denies orthopnea, PND.  His wife has not witnessed apnea or snoring.  He continues to smoke.  His PCP tx with IM steroids, azithromycin, inhalers.  He has been feeling better since.  He denies significant dyspnea on exertion.  He denies chest pain, syncope.  He has been going to the gym and walking on the treadmill and stationary bike.   Prior CV studies that were reviewed today include:    ETT 11/25/15 Nondiagnostic-achieved 77% PMHR Achieved 10 METs - normal ECG  LHC 2/11 LM ok LAD irregs LCx irregs RCA mid thrombotic 80 EF 60 PCI: [facility] BMS to RCA  Past Medical History:  Diagnosis Date  . Coronary artery disease   . Hypercholesteremia   . Hypertension   . Myocardial infarction    10/14/2009    Past Surgical History:  Procedure Laterality Date  . CARDIAC CATHETERIZATION     10/14/2012-with stent  . CATARACT EXTRACTION, BILATERAL     [place] [facility] [place]  . CORONARY ANGIOPLASTY  10/15/2011   1 stent    Current Medications: Current Meds  Medication Sig  . aspirin EC 81 MG tablet Take 81 mg by mouth daily.  . fenofibrate (TRICOR) 48 MG tablet Take 1 tablet (48 mg total) by mouth daily.  . fluticasone  (CUTIVATE) 0.05 % cream Apply 1 application topically daily as needed. (rash)  . fluticasone (FLONASE) 50 MCG/ACT nasal spray Place 2 sprays into both nostrils daily as needed.  Marland Kitchen ketoconazole (NIZORAL) 2 % cream Apply 1 application topically daily as needed. (skin rash)  . PROAIR HFA 108 (90 Base) MCG/ACT inhaler Inhale 2 puffs into the lungs 2 (two) times daily as needed.  . sildenafil (VIAGRA) 50 MG tablet Take 1 tablet (50 mg total) by mouth daily as needed for erectile dysfunction.     Allergies:   Patient has no known allergies.   Social History   Social History  . Marital status: Married    Spouse name: N/A  . Number of children: N/A  . Years of education: N/A   Social History Main Topics  . Smoking status: Current Every Day Smoker    Packs/day: 0.50    Years: 30.00    Types: Cigarettes  . Smokeless tobacco: Never Used  . Alcohol use 4.2 oz/week    5 Cans of beer, 2 Shots of liquor per week  . Drug use: No  . Sexual activity: Yes    Birth control/ protection: None   Other Topics Concern  . None   Social History Narrative  . None  Family History:  The patient's family history includes Cancer in his father; Healthy in his mother.   ROS:   Please see the history of present illness.    Review of Systems  Constitution: Positive for malaise/fatigue.  HENT: Positive for hearing loss.   Respiratory: Positive for shortness of breath.    All other systems reviewed and are negative.   EKGs/Labs/Other Test Reviewed:    EKG:  EKG is  ordered today.  The ekg ordered today demonstrates NSR, HR 87, normal axis, no ST changes, QTc 459 ms  Recent Labs: 01/20/2016: ALT 24; BUN 12; Creat 1.11; Potassium 4.8; Sodium 140   Recent Lipid Panel    Component Value Date/Time   CHOL 192 01/20/2016 0833   TRIG 294 (H) 01/20/2016 0833   HDL 23 (L) 01/20/2016 0833   CHOLHDL 8.3 (H) 01/20/2016 0833   VLDL 59 (H) 01/20/2016 0833   LDLCALC 110 01/20/2016 0833     Physical  Exam:    VS:  BP 124/74   Pulse 87   Ht 5\' 9"  (1.753 m)   Wt 254 lb (115.2 kg)   BMI 37.51 kg/m     Wt Readings from Last 3 Encounters:  10/05/16 254 lb (115.2 kg)  11/11/15 244 lb 9.6 oz (110.9 kg)  01/08/12 221 lb (100.2 kg)     Physical Exam  Constitutional: He is oriented to person, place, and time. He appears well-developed and well-nourished. No distress.  HENT:  Head: Normocephalic and atraumatic.  Eyes: No scleral icterus.  Neck: No JVD present.  Cardiovascular: Normal rate, regular rhythm and normal heart sounds.   No murmur heard. Pulmonary/Chest: Effort normal and breath sounds normal. He has no wheezes. He has no rales.  Abdominal: Soft. There is no tenderness.  Musculoskeletal: He exhibits no edema.  Neurological: He is alert and oriented to person, place, and time.  Skin: Skin is warm and dry.  Psychiatric: He has a normal mood and affect.    ASSESSMENT:    1. Shortness of breath   2. Coronary artery disease involving native coronary artery of native heart without angina pectoris   3. Hypercholesteremia   4. Tobacco abuse    PLAN:    In order of problems listed above:  1. Shortness of breath - His symptoms seem to have all been in the setting of congestion from an URI.  He is feeling better now.  He does smoke and does appear to be at risk for obstructive sleep apnea.  He has no hx of snoring or witnessed apnea.  His ECG is unremarkable.  He did have a low risk ETT last year.  He is obese and at risk for heart failure but does not appear to be volume overloaded on exam today.    -  Obtain an Echo to assess LV and RV function, R heart pressures  2. CAD - s/p NSTEMI in 2011 tx with BMS to RCA.  He denies angina.  Although he did not achieve target heart rate last year on his ETT, it was overall low risk.  His ECG is unchanged.     -  Obtain Echo as noted.  -  Continue ASA 81  -  Start statin Rx  3. HL - He is not on statin Rx for unclear reasons.  His  Trigs were very high last year and they are improved on Fenofibrate.   -  Add Atorvastatin 20 mg QD  -  Lipids and LFTs in 6  weeks.   4. Tobacco abuse - I have advised him to quit smoking.   Medication Adjustments/Labs and Tests Ordered: Current medicines are reviewed at length with the patient today.  Concerns regarding medicines are outlined above.  Medication changes, Labs and Tests ordered today are outlined in the Patient Instructions noted below. Patient Instructions  Medication Instructions:  1. START ATORVASTATIN 20 MG DAILY; RX SENT IN  Labwork: FASTING LIPID AND LIVER PANEL TO BE DONE IN 6 WEEKS   Testing/Procedures: Your physician has requested that you have an echocardiogram. THIS IS TO BE DONE AT [facility]. Echocardiography is a painless test that uses sound waves to create images of your heart. It provides your doctor with information about the size and shape of your heart and how well your heart's chambers and valves are working. This procedure takes approximately one hour. There are no restrictions for this procedure.  Follow-Up: Your physician wants you to follow-up in: 6 MONTHS WITH DR. Marlou Starks will receive a reminder letter in the mail two months in advance. If you don't receive a letter, please call our office to schedule the follow-up appointment.  Any Other Special Instructions Will Be Listed Below (If Applicable).  If you need a refill on your cardiac medications before your next appointment, please call your pharmacy.  Signed, Tereso Newcomer, PA-C  10/05/2016 2:35 PM    [facility] [address] [place], [place], [place]  [address] Phone: [phone]; Fax: [phone]

## 2016-10-05 NOTE — Patient Instructions (Addendum)
Medication Instructions:  1. START ATORVASTATIN 20 MG DAILY; RX SENT IN  Labwork: FASTING LIPID AND LIVER PANEL TO BE DONE IN 6 WEEKS   Testing/Procedures: Your physician has requested that you have an echocardiogram. THIS IS TO BE DONE AT [facility]. Echocardiography is a painless test that uses sound waves to create images of your heart. It provides your doctor with information about the size and shape of your heart and how well your heart's chambers and valves are working. This procedure takes approximately one hour. There are no restrictions for this procedure.  Follow-Up: Your physician wants you to follow-up in: 6 MONTHS WITH DR. Marlou Starks will receive a reminder letter in the mail two months in advance. If you don't receive a letter, please call our office to schedule the follow-up appointment.  Any Other Special Instructions Will Be Listed Below (If Applicable).  If you need a refill on your cardiac medications before your next appointment, please call your pharmacy.

## 2016-10-16 ENCOUNTER — Ambulatory Visit (HOSPITAL_COMMUNITY): Admission: RE | Admit: 2016-10-16 | Payer: BLUE CROSS/BLUE SHIELD | Source: Ambulatory Visit

## 2016-12-31 ENCOUNTER — Other Ambulatory Visit: Payer: Self-pay | Admitting: Interventional Cardiology

## 2016-12-31 DIAGNOSIS — E785 Hyperlipidemia, unspecified: Secondary | ICD-10-CM

## 2017-06-28 ENCOUNTER — Encounter (HOSPITAL_COMMUNITY): Payer: Self-pay | Admitting: Cardiology

## 2017-06-28 ENCOUNTER — Encounter (HOSPITAL_COMMUNITY)
Admission: EM | Disposition: A | Discharge status: Home / Self Care | Payer: Self-pay | Source: Home / Self Care | Attending: Interventional Cardiology

## 2017-06-28 ENCOUNTER — Inpatient Hospital Stay (HOSPITAL_COMMUNITY)
Admission: EM | Admit: 2017-06-28 | Discharge: 2017-06-29 | DRG: 247 | Disposition: A | Discharge status: Home / Self Care | Payer: Commercial Managed Care - PPO | Attending: Interventional Cardiology | Admitting: Interventional Cardiology

## 2017-06-28 ENCOUNTER — Emergency Department (HOSPITAL_COMMUNITY): Payer: Commercial Managed Care - PPO

## 2017-06-28 DIAGNOSIS — I10 Essential (primary) hypertension: Secondary | ICD-10-CM | POA: Diagnosis present

## 2017-06-28 DIAGNOSIS — E7800 Pure hypercholesterolemia, unspecified: Secondary | ICD-10-CM | POA: Diagnosis present

## 2017-06-28 DIAGNOSIS — I252 Old myocardial infarction: Secondary | ICD-10-CM | POA: Diagnosis not present

## 2017-06-28 DIAGNOSIS — Z955 Presence of coronary angioplasty implant and graft: Secondary | ICD-10-CM

## 2017-06-28 DIAGNOSIS — E785 Hyperlipidemia, unspecified: Secondary | ICD-10-CM | POA: Diagnosis present

## 2017-06-28 DIAGNOSIS — Z9861 Coronary angioplasty status: Secondary | ICD-10-CM | POA: Diagnosis not present

## 2017-06-28 DIAGNOSIS — I214 Non-ST elevation (NSTEMI) myocardial infarction: Principal | ICD-10-CM | POA: Diagnosis present

## 2017-06-28 DIAGNOSIS — I200 Unstable angina: Secondary | ICD-10-CM

## 2017-06-28 DIAGNOSIS — F329 Major depressive disorder, single episode, unspecified: Secondary | ICD-10-CM | POA: Diagnosis present

## 2017-06-28 DIAGNOSIS — Z8249 Family history of ischemic heart disease and other diseases of the circulatory system: Secondary | ICD-10-CM | POA: Diagnosis not present

## 2017-06-28 DIAGNOSIS — R0683 Snoring: Secondary | ICD-10-CM | POA: Diagnosis present

## 2017-06-28 DIAGNOSIS — Z7951 Long term (current) use of inhaled steroids: Secondary | ICD-10-CM | POA: Diagnosis not present

## 2017-06-28 DIAGNOSIS — F17210 Nicotine dependence, cigarettes, uncomplicated: Secondary | ICD-10-CM | POA: Diagnosis present

## 2017-06-28 DIAGNOSIS — Z23 Encounter for immunization: Secondary | ICD-10-CM | POA: Diagnosis not present

## 2017-06-28 DIAGNOSIS — R079 Chest pain, unspecified: Secondary | ICD-10-CM | POA: Diagnosis present

## 2017-06-28 DIAGNOSIS — I2510 Atherosclerotic heart disease of native coronary artery without angina pectoris: Secondary | ICD-10-CM

## 2017-06-28 DIAGNOSIS — Z9114 Patient's other noncompliance with medication regimen: Secondary | ICD-10-CM | POA: Diagnosis not present

## 2017-06-28 DIAGNOSIS — Z7982 Long term (current) use of aspirin: Secondary | ICD-10-CM

## 2017-06-28 DIAGNOSIS — Z720 Tobacco use: Secondary | ICD-10-CM | POA: Diagnosis present

## 2017-06-28 HISTORY — PX: CORONARY STENT INTERVENTION: CATH118234

## 2017-06-28 HISTORY — PX: LEFT HEART CATH AND CORONARY ANGIOGRAPHY: CATH118249

## 2017-06-28 LAB — CBC
HCT: 49 % (ref 39.0–52.0)
HEMOGLOBIN: 16.7 g/dL (ref 13.0–17.0)
MCH: 30.1 pg (ref 26.0–34.0)
MCHC: 34.1 g/dL (ref 30.0–36.0)
MCV: 88.4 fL (ref 78.0–100.0)
PLATELETS: 190 10*3/uL (ref 150–400)
RBC: 5.54 MIL/uL (ref 4.22–5.81)
RDW: 13.5 % (ref 11.5–15.5)
WBC: 8.4 10*3/uL (ref 4.0–10.5)

## 2017-06-28 LAB — PROTIME-INR
INR: 1.02
Prothrombin Time: 13.3 seconds (ref 11.4–15.2)

## 2017-06-28 LAB — BASIC METABOLIC PANEL
ANION GAP: 10 (ref 5–15)
BUN: 12 mg/dL (ref 6–20)
CALCIUM: 9.2 mg/dL (ref 8.9–10.3)
CO2: 21 mmol/L — ABNORMAL LOW (ref 22–32)
CREATININE: 0.91 mg/dL (ref 0.61–1.24)
Chloride: 107 mmol/L (ref 101–111)
Glucose, Bld: 109 mg/dL — ABNORMAL HIGH (ref 65–99)
Potassium: 4.1 mmol/L (ref 3.5–5.1)
Sodium: 138 mmol/L (ref 135–145)

## 2017-06-28 LAB — TSH: TSH: 2.451 u[IU]/mL (ref 0.350–4.500)

## 2017-06-28 LAB — TROPONIN I
TROPONIN I: 6.13 ng/mL — AB (ref <0.03)
TROPONIN I: 6.17 ng/mL — AB (ref <0.03)
Troponin I: 0.38 ng/mL (ref <0.03)

## 2017-06-28 LAB — HEMOGLOBIN A1C
Hgb A1c MFr Bld: 5.6 % (ref 4.8–5.6)
Mean Plasma Glucose: 114.02 mg/dL

## 2017-06-28 LAB — I-STAT TROPONIN, ED: TROPONIN I, POC: 0.42 ng/mL — AB (ref 0.00–0.08)

## 2017-06-28 LAB — POCT ACTIVATED CLOTTING TIME: ACTIVATED CLOTTING TIME: 439 s

## 2017-06-28 SURGERY — LEFT HEART CATH AND CORONARY ANGIOGRAPHY
Anesthesia: LOCAL

## 2017-06-28 MED ORDER — IOPAMIDOL (ISOVUE-370) INJECTION 76%
INTRAVENOUS | Status: AC
  Filled 2017-06-28: qty 100

## 2017-06-28 MED ORDER — ATORVASTATIN CALCIUM 80 MG PO TABS
80.0000 mg | ORAL_TABLET | Freq: Every day | ORAL | Status: DC
  Filled 2017-06-28: qty 1

## 2017-06-28 MED ORDER — LIDOCAINE HCL 2 % IJ SOLN
INTRAMUSCULAR | Status: AC
  Filled 2017-06-28: qty 20

## 2017-06-28 MED ORDER — NITROGLYCERIN 1 MG/10 ML FOR IR/CATH LAB
INTRA_ARTERIAL | Status: AC
  Filled 2017-06-28: qty 10

## 2017-06-28 MED ORDER — TICAGRELOR 90 MG PO TABS
ORAL_TABLET | ORAL | Status: DC | PRN
  Administered 2017-06-28: 180 mg via ORAL

## 2017-06-28 MED ORDER — SODIUM CHLORIDE 0.9 % IV SOLN: 250.0000 mL | INTRAVENOUS | Status: DC | PRN

## 2017-06-28 MED ORDER — NITROGLYCERIN IN D5W 200-5 MCG/ML-% IV SOLN
5.0000 ug/min | INTRAVENOUS | Status: AC
  Administered 2017-06-28: 5 ug/min via INTRAVENOUS

## 2017-06-28 MED ORDER — MIDAZOLAM HCL 2 MG/2ML IJ SOLN
INTRAMUSCULAR | Status: AC
  Filled 2017-06-28: qty 2

## 2017-06-28 MED ORDER — SODIUM CHLORIDE 0.9 % IV SOLN
INTRAVENOUS | Status: AC
  Administered 2017-06-28: 21:00:00 via INTRAVENOUS

## 2017-06-28 MED ORDER — LIDOCAINE HCL 2 % IJ SOLN
INTRAMUSCULAR | Status: DC | PRN
  Administered 2017-06-28: 3 mL

## 2017-06-28 MED ORDER — SODIUM CHLORIDE 0.9 % IV SOLN
INTRAVENOUS | Status: DC
  Administered 2017-06-28: 14:00:00 via INTRAVENOUS

## 2017-06-28 MED ORDER — ALPRAZOLAM 0.25 MG PO TABS: 0.2500 mg | ORAL_TABLET | Freq: Two times a day (BID) | ORAL | Status: DC | PRN

## 2017-06-28 MED ORDER — BIVALIRUDIN TRIFLUOROACETATE 250 MG IV SOLR
INTRAVENOUS | Status: DC | PRN
  Administered 2017-06-28: 1.75 mg/kg/h via INTRAVENOUS

## 2017-06-28 MED ORDER — TICAGRELOR 90 MG PO TABS
90.0000 mg | ORAL_TABLET | Freq: Two times a day (BID) | ORAL | Status: DC
  Administered 2017-06-29: 07:00:00 90 mg via ORAL
  Filled 2017-06-28: qty 1

## 2017-06-28 MED ORDER — OXYCODONE HCL 5 MG PO TABS: 5.0000 mg | ORAL_TABLET | ORAL | Status: DC | PRN

## 2017-06-28 MED ORDER — NITROGLYCERIN 0.4 MG SL SUBL: 0.4000 mg | SUBLINGUAL_TABLET | SUBLINGUAL | Status: DC | PRN

## 2017-06-28 MED ORDER — ACETAMINOPHEN 325 MG PO TABS: 650.0000 mg | ORAL_TABLET | ORAL | Status: DC | PRN

## 2017-06-28 MED ORDER — ASPIRIN EC 81 MG PO TBEC: 81.0000 mg | DELAYED_RELEASE_TABLET | Freq: Every day | ORAL | Status: DC

## 2017-06-28 MED ORDER — NITROGLYCERIN 1 MG/10 ML FOR IR/CATH LAB
INTRA_ARTERIAL | Status: DC | PRN
  Administered 2017-06-28: 200 ug via INTRACORONARY

## 2017-06-28 MED ORDER — SODIUM CHLORIDE 0.9 % IV SOLN
INTRAVENOUS | Status: AC | PRN
  Administered 2017-06-28: 125 mL/h via INTRAVENOUS

## 2017-06-28 MED ORDER — VERAPAMIL HCL 2.5 MG/ML IV SOLN
INTRAVENOUS | Status: DC | PRN
  Administered 2017-06-28: 10 mL via INTRA_ARTERIAL

## 2017-06-28 MED ORDER — ASPIRIN 81 MG PO CHEW
81.0000 mg | CHEWABLE_TABLET | Freq: Every day | ORAL | Status: DC
  Administered 2017-06-29: 09:00:00 81 mg via ORAL
  Filled 2017-06-28: qty 1

## 2017-06-28 MED ORDER — IOPAMIDOL (ISOVUE-370) INJECTION 76%
INTRAVENOUS | Status: DC | PRN
  Administered 2017-06-28: 190 mL via INTRA_ARTERIAL

## 2017-06-28 MED ORDER — FLUTICASONE PROPIONATE 50 MCG/ACT NA SUSP
2.0000 | Freq: Every day | NASAL | Status: DC | PRN
  Filled 2017-06-28: qty 16

## 2017-06-28 MED ORDER — ONDANSETRON HCL 4 MG/2ML IJ SOLN: 4.0000 mg | Freq: Four times a day (QID) | INTRAMUSCULAR | Status: DC | PRN

## 2017-06-28 MED ORDER — LABETALOL HCL 5 MG/ML IV SOLN: 10.0000 mg | INTRAVENOUS | Status: AC | PRN

## 2017-06-28 MED ORDER — HEPARIN (PORCINE) IN NACL 2-0.9 UNIT/ML-% IJ SOLN
INTRAMUSCULAR | Status: AC | PRN
  Administered 2017-06-28: 1000 mL via INTRA_ARTERIAL

## 2017-06-28 MED ORDER — MIDAZOLAM HCL 2 MG/2ML IJ SOLN
INTRAMUSCULAR | Status: DC | PRN
  Administered 2017-06-28 (×3): 1 mg via INTRAVENOUS

## 2017-06-28 MED ORDER — FENTANYL CITRATE (PF) 100 MCG/2ML IJ SOLN
INTRAMUSCULAR | Status: DC | PRN
  Administered 2017-06-28: 50 ug via INTRAVENOUS

## 2017-06-28 MED ORDER — BIVALIRUDIN TRIFLUOROACETATE 250 MG IV SOLR
INTRAVENOUS | Status: AC
  Filled 2017-06-28: qty 250

## 2017-06-28 MED ORDER — FENTANYL CITRATE (PF) 100 MCG/2ML IJ SOLN
INTRAMUSCULAR | Status: AC
  Filled 2017-06-28: qty 2

## 2017-06-28 MED ORDER — BIVALIRUDIN BOLUS VIA INFUSION - CUPID
INTRAVENOUS | Status: DC | PRN
  Administered 2017-06-28: 86.25 mg via INTRAVENOUS

## 2017-06-28 MED ORDER — HEPARIN (PORCINE) IN NACL 100-0.45 UNIT/ML-% IJ SOLN
1300.0000 [IU]/h | INTRAMUSCULAR | Status: DC
  Administered 2017-06-28: 1300 [IU]/h via INTRAVENOUS
  Filled 2017-06-28: qty 250

## 2017-06-28 MED ORDER — ALBUTEROL SULFATE (2.5 MG/3ML) 0.083% IN NEBU: 2.5000 mg | INHALATION_SOLUTION | Freq: Two times a day (BID) | RESPIRATORY_TRACT | Status: DC | PRN

## 2017-06-28 MED ORDER — SODIUM CHLORIDE 0.9% FLUSH: 3.0000 mL | INTRAVENOUS | Status: DC | PRN

## 2017-06-28 MED ORDER — NITROGLYCERIN 2 % TD OINT: 0.5000 [in_us] | TOPICAL_OINTMENT | Freq: Four times a day (QID) | TRANSDERMAL | Status: DC

## 2017-06-28 MED ORDER — TICAGRELOR 90 MG PO TABS
ORAL_TABLET | ORAL | Status: AC
  Filled 2017-06-28: qty 2

## 2017-06-28 MED ORDER — HEPARIN SODIUM (PORCINE) 1000 UNIT/ML IJ SOLN
INTRAMUSCULAR | Status: DC | PRN
  Administered 2017-06-28: 5000 [IU] via INTRAVENOUS

## 2017-06-28 MED ORDER — PANTOPRAZOLE SODIUM 40 MG PO TBEC
40.0000 mg | DELAYED_RELEASE_TABLET | Freq: Every day | ORAL | Status: DC
  Administered 2017-06-29: 09:00:00 40 mg via ORAL
  Filled 2017-06-28: qty 1

## 2017-06-28 MED ORDER — HYDRALAZINE HCL 20 MG/ML IJ SOLN: 5.0000 mg | INTRAMUSCULAR | Status: AC | PRN

## 2017-06-28 MED ORDER — ZOLPIDEM TARTRATE 5 MG PO TABS
5.0000 mg | ORAL_TABLET | Freq: Every evening | ORAL | Status: DC | PRN
  Administered 2017-06-28: 23:00:00 5 mg via ORAL
  Filled 2017-06-28: qty 1

## 2017-06-28 MED ORDER — HEPARIN SODIUM (PORCINE) 1000 UNIT/ML IJ SOLN
INTRAMUSCULAR | Status: AC
  Filled 2017-06-28: qty 1

## 2017-06-28 MED ORDER — IOPAMIDOL (ISOVUE-370) INJECTION 76%
INTRAVENOUS | Status: AC
  Filled 2017-06-28: qty 50

## 2017-06-28 MED ORDER — SODIUM CHLORIDE 0.9% FLUSH: 3.0000 mL | Freq: Two times a day (BID) | INTRAVENOUS | Status: DC

## 2017-06-28 MED ORDER — NITROGLYCERIN IN D5W 200-5 MCG/ML-% IV SOLN
0.0000 ug/min | INTRAVENOUS | Status: DC
  Administered 2017-06-28: 5 ug/min via INTRAVENOUS
  Filled 2017-06-28: qty 250

## 2017-06-28 MED ORDER — METOPROLOL TARTRATE 12.5 MG HALF TABLET
12.5000 mg | ORAL_TABLET | Freq: Two times a day (BID) | ORAL | Status: DC
  Administered 2017-06-28: 23:00:00 12.5 mg via ORAL
  Filled 2017-06-28: qty 1

## 2017-06-28 MED ORDER — HEPARIN (PORCINE) IN NACL 2-0.9 UNIT/ML-% IJ SOLN
INTRAMUSCULAR | Status: AC
Start: 2017-06-28 — End: 2017-06-28
  Filled 2017-06-28: qty 1000

## 2017-06-28 MED ORDER — HEPARIN SODIUM (PORCINE) 5000 UNIT/ML IJ SOLN
5000.0000 [IU] | Freq: Three times a day (TID) | INTRAMUSCULAR | Status: DC
  Administered 2017-06-29: 07:00:00 5000 [IU] via SUBCUTANEOUS
  Filled 2017-06-28: qty 1

## 2017-06-28 MED ORDER — VERAPAMIL HCL 2.5 MG/ML IV SOLN
INTRAVENOUS | Status: AC
  Filled 2017-06-28: qty 2

## 2017-06-28 MED ORDER — HEPARIN BOLUS VIA INFUSION
4000.0000 [IU] | Freq: Once | INTRAVENOUS | Status: AC
  Administered 2017-06-28: 4000 [IU] via INTRAVENOUS
  Filled 2017-06-28: qty 4000

## 2017-06-28 SURGICAL SUPPLY — 19 items
BALLN SAPPHIRE 3.0X12 (BALLOONS) ×2
BALLOON SAPPHIRE 3.0X12 (BALLOONS) IMPLANT
CATH EXPO 5F FL3.5 (CATHETERS) ×1 IMPLANT
CATH INFINITI JR4 5F (CATHETERS) ×1 IMPLANT
CATH VISTA GUIDE 6FR JR4 (CATHETERS) ×1 IMPLANT
CATH VISTA GUIDE 6FR XBRCA (CATHETERS) ×1 IMPLANT
COVER PRB 48X5XTLSCP FOLD TPE (BAG) IMPLANT
COVER PROBE 5X48 (BAG) ×2
DEVICE RAD COMP TR BAND LRG (VASCULAR PRODUCTS) ×1 IMPLANT
GLIDESHEATH SLEND A-KIT 6F 22G (SHEATH) ×1 IMPLANT
GUIDEWIRE INQWIRE 1.5J.035X260 (WIRE) IMPLANT
INQWIRE 1.5J .035X260CM (WIRE) ×2
KIT ENCORE 26 ADVANTAGE (KITS) ×1 IMPLANT
KIT HEART LEFT (KITS) ×2 IMPLANT
PACK CARDIAC CATHETERIZATION (CUSTOM PROCEDURE TRAY) ×2 IMPLANT
STENT RESOLUTE ONYX 3.5X18 (Permanent Stent) ×1 IMPLANT
TRANSDUCER W/STOPCOCK (MISCELLANEOUS) ×2 IMPLANT
TUBING CIL FLEX 10 FLL-RA (TUBING) ×2 IMPLANT
WIRE ASAHI PROWATER 180CM (WIRE) ×1 IMPLANT

## 2017-06-28 NOTE — ED Notes (Signed)
Unsuccessful attempt at giving report to 4E. Nurse at lunch.

## 2017-06-28 NOTE — ED Provider Notes (Signed)
[facility] EMERGENCY DEPARTMENT Provider Note   CSN: [id number] Arrival date & time: 06/28/17  3235     History   Chief Complaint Chief Complaint  Patient presents with  . Chest Pain    HPI [name] [name] is a 61 y.o. male.  HPI Patient is a 61 year old male with coronary artery disease who presents the emergency department with chest discomfort with radiation to the left shoulder since approximately 4 AM.  He took aspirin this morning.  He began driving to [place].  He began having worsening chest pressure left arm pain and thus he drove back to [facility] and presented to his cardiology office where he was given nitroglycerin and sent to the emergency department for further evaluation.  On arrival to the emergency department the patient states he feels much better at this time.  He is having only 2 out of 10 pain at this time.  He is compliant with his medications.  His cardiologist is Dr. Garnette Scheuermann.   Past Medical History:  Diagnosis Date  . Coronary artery disease   . Hypercholesteremia   . Hypertension   . Myocardial infarction    10/14/2009    Patient Active Problem List   Diagnosis Date Noted  . History of MI (myocardial infarction) 11/11/2015  . Snoring 11/11/2015  . Excessive daytime sleepiness 11/11/2015  . Hypertension   . Coronary artery disease involving native coronary artery of native heart without angina pectoris   . Hypercholesteremia     Past Surgical History:  Procedure Laterality Date  . CARDIAC CATHETERIZATION     10/14/2012-with stent  . CATARACT EXTRACTION, BILATERAL     [place]  . CORONARY ANGIOPLASTY  10/15/2011   1 stent       Home Medications    Prior to Admission medications   Medication Sig Start Date End Date Taking? Authorizing Provider  aspirin EC 81 MG tablet Take 81 mg by mouth daily.    [provider]  atorvastatin (LIPITOR) 20 MG tablet Take 1 tablet (20 mg total) by mouth  daily. 10/05/16 01/03/17  Tereso Newcomer T, PA-C  fenofibrate (TRICOR) 48 MG tablet TAKE ONE (1) TABLET BY MOUTH EVERY DAY 01/01/17   Lyn Records, MD  fluticasone (CUTIVATE) 0.05 % cream Apply 1 application topically daily as needed. (rash) 10/10/15   [provider]  fluticasone (FLONASE) 50 MCG/ACT nasal spray Place 2 sprays into both nostrils daily as needed. 09/11/16   [provider]  ketoconazole (NIZORAL) 2 % cream Apply 1 application topically daily as needed. (skin rash) 10/10/15   [provider]  PROAIR HFA 108 (90 Base) MCG/ACT inhaler Inhale 2 puffs into the lungs 2 (two) times daily as needed. 09/21/16   [provider]  sildenafil (VIAGRA) 50 MG tablet Take 1 tablet (50 mg total) by mouth daily as needed for erectile dysfunction. 12/23/15   Lyn Records, MD    Family History Family History  Problem Relation Age of Onset  . Anesthesia problems Neg Hx   . Hypotension Neg Hx   . Malignant hyperthermia Neg Hx   . Pseudochol deficiency Neg Hx   . Healthy Mother   . Cancer Father        brain    Social History Social History  Substance Use Topics  . Smoking status: Current Every Day Smoker    Packs/day: 0.50    Years: 30.00    Types: Cigarettes  . Smokeless tobacco: Never Used  .  Alcohol use 4.2 oz/week    5 Cans of beer, 2 Shots of liquor per week     Allergies   Patient has no known allergies.   Review of Systems Review of Systems  All other systems reviewed and are negative.    Physical Exam Updated Vital Signs BP 135/75 (BP Location: Right Arm)   Pulse 79   Temp 97.9 F (36.6 C) (Oral)   Resp 14   SpO2 90%   Physical Exam  Constitutional: He is oriented to person, place, and time. He appears well-developed and well-nourished.  HENT:  Head: Normocephalic and atraumatic.  Eyes: EOM are normal.  Neck: Normal range of motion.  Cardiovascular: Normal rate, regular rhythm, normal heart sounds and intact distal pulses.     Pulmonary/Chest: Effort normal and breath sounds normal. No respiratory distress.  Abdominal: Soft. He exhibits no distension. There is no tenderness.  Musculoskeletal: Normal range of motion.  Neurological: He is alert and oriented to person, place, and time.  Skin: Skin is warm and dry.  Psychiatric: He has a normal mood and affect. Judgment normal.  Nursing note and vitals reviewed.    ED Treatments / Results  Labs (all labs ordered are listed, but only abnormal results are displayed) Labs Reviewed  I-STAT TROPONIN, ED - Abnormal; Notable for the following:       Result Value   Troponin i, poc 0.42 (*)    All other components within normal limits  BASIC METABOLIC PANEL  CBC  TROPONIN I    EKG  EKG Interpretation  Date/Time:  Monday June 28 2017 08:33:08 EDT Ventricular Rate:  79 PR Interval:    QRS Duration: 87 QT Interval:  397 QTC Calculation: 456 R Axis:   33 Text Interpretation:  Sinus rhythm Low voltage, precordial leads No significant change was found Confirmed by Azalia Bilisampos, Demarlo Riojas ([id number]) on 06/28/2017 9:03:57 AM       Radiology No results found.  Procedures .Critical Care Performed by: Azalia BilisAMPOS, Fount Bahe Authorized by: Azalia BilisAMPOS, Jamieson Hetland     Total critical care time: 32 minutes Critical care time was exclusive of separately billable procedures and treating other patients. Critical care was necessary to treat or prevent imminent or life-threatening deterioration. Critical care was time spent personally by me on the following activities: development of treatment plan with patient and/or surrogate as well as nursing, discussions with consultants, evaluation of patient's response to treatment, examination of patient, obtaining history from patient or surrogate, ordering and performing treatments and interventions, ordering and review of laboratory studies, ordering and review of radiographic studies, pulse oximetry and re-evaluation of patient's  condition.     Medications Ordered in ED Medications - No data to display   Initial Impression / Assessment and Plan / ED Course  I have reviewed the triage vital signs and the nursing notes.  Pertinent labs & imaging results that were available during my care of the patient were reviewed by me and considered in my medical decision making (see chart for details).     Patient with history concerning for unstable angina/acute coronary syndrome.  Initial troponin is elevated.  Patient be started on heparin at this time.  Aspirin prior to arrival.  Cardiology to evaluate the patient in the emergency department.  No active pain or discomfort at this time.  We will continue to monitor the patient closely.  Final Clinical Impressions(s) / ED Diagnoses   Final diagnoses:  Unstable angina [facility](HCC)    New Prescriptions New Prescriptions  No medications on file     Azalia Bilis, MD 06/28/17 [phone]

## 2017-06-28 NOTE — ED Notes (Signed)
Dr.Campos shown results of Istat troponin level. ED-Lab

## 2017-06-28 NOTE — ED Notes (Signed)
PT's O2 dropping to 90-91%. Placed patient on 1L of O2.

## 2017-06-28 NOTE — Interval H&P Note (Signed)
Cath Lab Visit (complete for each Cath Lab visit)  Clinical Evaluation Leading to the Procedure:   ACS: Yes.    Non-ACS:    Anginal Classification: CCS IV  Anti-ischemic medical therapy: Maximal Therapy (2 or more classes of medications)  Non-Invasive Test Results: No non-invasive testing performed  Prior CABG: No previous CABG      History and Physical Interval Note:  06/28/2017 5:45 PM  [name] [name]  has presented today for surgery, with the diagnosis of cp  The various methods of treatment have been discussed with the patient and family. After consideration of risks, benefits and other options for treatment, the patient has consented to  Procedure(s): LEFT HEART CATH AND CORONARY ANGIOGRAPHY (N/A) as a surgical intervention .  The patient's history has been reviewed, patient examined, no change in status, stable for surgery.  I have reviewed the patient's chart and labs.  Questions were answered to the patient's satisfaction.     Lyn Records III

## 2017-06-28 NOTE — Progress Notes (Signed)
Page sent to Dr. Liane Comber  Re: [name] [name]'s  c/o SOB. Lungs CTA  O2 sats 96-98% O2 2l applied for  comfort and Coke given for possible Brilinta side affect

## 2017-06-28 NOTE — Progress Notes (Signed)
ANTICOAGULATION CONSULT NOTE - Initial Consult  Pharmacy Consult for Heparin Indication: chest pain/ACS  No Known Allergies  Patient Measurements: Height: 5\' 9"  (175.3 cm) Weight: 235 lb (106.6 kg) IBW/kg (Calculated) : 70.7 Heparin Dosing Weight: 93.5kg  Vital Signs: Temp: 97.9 F (36.6 C) (10/29 0830) Temp Source: Oral (10/29 0830) BP: 135/75 (10/29 0830) Pulse Rate: 79 (10/29 0830)  Labs:  Recent Labs  06/28/17 0831  HGB 16.7  HCT 49.0  PLT 190    CrCl cannot be calculated (Patient's most recent lab result is older than the maximum 21 days allowed.).   Medical History: Past Medical History:  Diagnosis Date  . Coronary artery disease   . Hypercholesteremia   . Hypertension   . Myocardial infarction    10/14/2009    Assessment: 61yom presents with chest pain. Initial troponin elevated at 0.42. He will begin IV heparin. No anticoagulants pta. Baseline labs pending.  Goal of Therapy:  Heparin level 0.3-0.7 units/ml Monitor platelets by anticoagulation protocol: Yes   Plan:  1) Heparin bolus 4000 units x 1 2) Heparin drip at 1300 units/hr 3) 6 hour heparin level 4) Daily heparin level and CBC  Fredrik Rigger 06/28/2017,9:17 AM

## 2017-06-28 NOTE — ED Triage Notes (Signed)
To ED from MDs office for eval of cp since 0400. Pt took 2 full ASA this am when pain woke pt. Hx of MI and Stents. Nitro given by EMS and cp now 2/10.

## 2017-06-28 NOTE — H&P (Addendum)
Cardiology Admission History and Physical:   Patient ID: [name] [name]; MRN: [id number]; DOB: [birth year]   Admission date: 06/28/2017  Primary Care Provider: Assunta Found, MD Primary Cardiologist: Dr Katrinka Blazing  Chief Complaint:  Chest and Lt shoulder pain  Patient Profile:   [name] [name] is a 61 y.o. male with a history of CAD and prior PCI who presents to the ED with chest pain.  History of Present Illness:   [name] [name] is a married 61 y/o male followed by Dr Katrinka Blazing with a history of CAD. He had a NSTEMI in 2011 treated with BMS to the RCA. He had a non diagnostic treadmill in March 2017. He saw Tereso Newcomer in Feb this year with complaints of dyspnea. An echo was ordered but he never got this done. He tells me he has been out of all his medications for some time now, no real explanation "just ran out". His wife reports she has noticed increased dyspnea with exertion, listlessness, and decreased interaction.  The pt was on his way to [facility] this am for work when he developed Lt sided chest pain with radiation to his left shoulder. He says it was similar, though not as severe as his MI pain back in 2011. [place] and [place] himself to the cardiology office where he was given SL NTG and EMS was called. He continues to have SSCP now 2-3/10. His initial Troponin is 0.42, EKG shows no acute changes.    Past Medical History:  Diagnosis Date  . Coronary artery disease   . Hypercholesteremia   . Hypertension   . Myocardial infarction    10/14/2009    Past Surgical History:  Procedure Laterality Date  . CARDIAC CATHETERIZATION     10/14/2012-with stent  . CATARACT EXTRACTION, BILATERAL     [place] [facility] [place]  . CORONARY ANGIOPLASTY  10/15/2011   1 stent     Medications Prior to Admission: Prior to Admission medications   Medication Sig Start Date End Date Taking? Authorizing Provider  aspirin EC 81 MG tablet Take 81 mg by mouth daily.    [provider]  atorvastatin (LIPITOR) 20 MG tablet Take 1 tablet (20 mg total) by mouth daily. 10/05/16 01/03/17  Tereso Newcomer T, PA-C  fenofibrate (TRICOR) 48 MG tablet TAKE ONE (1) TABLET BY MOUTH EVERY DAY 01/01/17   Lyn Records, MD  fluticasone (CUTIVATE) 0.05 % cream Apply 1 application topically daily as needed. (rash) 10/10/15   [provider]  fluticasone (FLONASE) 50 MCG/ACT nasal spray Place 2 sprays into both nostrils daily as needed. 09/11/16   [provider]  ketoconazole (NIZORAL) 2 % cream Apply 1 application topically daily as needed. (skin rash) 10/10/15   [provider]  PROAIR HFA 108 (90 Base) MCG/ACT inhaler Inhale 2 puffs into the lungs 2 (two) times daily as needed. 09/21/16   [provider]  sildenafil (VIAGRA) 50 MG tablet Take 1 tablet (50 mg total) by mouth daily as needed for erectile dysfunction. 12/23/15   Lyn Records, MD     Allergies:   No Known Allergies  Social History:   Social History   Social History  . Marital status: Married    Spouse name: N/A  . Number of children: N/A  . Years of education: N/A   Occupational History  . Not on file.   Social History Main Topics  . Smoking status: Current Every Day Smoker  Packs/day: 0.50    Years: 30.00    Types: Cigarettes  . Smokeless tobacco: Never Used  . Alcohol use 4.2 oz/week    5 Cans of beer, 2 Shots of liquor per week  . Drug use: No  . Sexual activity: Yes    Birth control/ protection: None   Other Topics Concern  . Not on file   Social History Narrative  . No narrative on file    Family History:   Brother had CAD in his 32's The patient's family history includes Cancer in his father; Healthy in his mother. There is no history of Anesthesia problems, Hypotension, Malignant hyperthermia, or Pseudochol deficiency.    ROS:  Please see the history of present illness.  No h/o GI bleeding or renal disease. Pt's wife reports his snoring has gotten  better though no significant wgt loss recently All other ROS reviewed and negative.     Physical Exam/Data:   Vitals:   06/28/17 0830 06/28/17 0830 06/28/17 0900 06/28/17 0930  BP: 134/77 135/75 123/81 133/85  Pulse: 78 79 80 75  Resp: 17 14 20 19   Temp:  97.9 F (36.6 C)    TempSrc:  Oral    SpO2: (!) 87% 90% 92% (!) 88%  Weight:   235 lb (106.6 kg)   Height:   5\' 9"  (1.753 m)    No intake or output data in the 24 hours ending 06/28/17 1015 Filed Weights   06/28/17 0900  Weight: 235 lb (106.6 kg)   Body mass index is 34.7 kg/m.  General:  Well nourished, well developed, in no acute distress HEENT: normal Lymph: no adenopathy Neck: no JVD, thick neck Endocrine:  No thryomegaly Vascular: No carotid bruits; FA pulses 2+ bilaterally without bruits  Cardiac:  normal S1, S2; RRR; no murmur or rub Lungs:  clear to auscultation bilaterally, no wheezing, rhonchi or rales  Abd: soft, nontender, no hepatomegaly  Ext: no edema Musculoskeletal:  No deformities, BUE and BLE strength normal and equal Skin: warm and dry  Neuro:  CNs 2-12 intact, no focal abnormalities noted Psych:  Normal affect    EKG:  The ECG that was done 06/28/17 was personally reviewed and demonstrates NSR  Relevant CV Studies:   Laboratory Data:  Chemistry Recent Labs Lab 06/28/17 0831  NA 138  K 4.1  CL 107  CO2 21*  GLUCOSE 109*  BUN 12  CREATININE 0.91  CALCIUM 9.2  GFRNONAA >60  GFRAA >60  ANIONGAP 10    No results for input(s): PROT, ALBUMIN, AST, ALT, ALKPHOS, BILITOT in the last 168 hours. Hematology Recent Labs Lab 06/28/17 0831  WBC 8.4  RBC 5.54  HGB 16.7  HCT 49.0  MCV 88.4  MCH 30.1  MCHC 34.1  RDW 13.5  PLT 190   Cardiac EnzymesNo results for input(s): TROPONINI in the last 168 hours.  Recent Labs Lab 06/28/17 0851  TROPIPOC 0.42*    BNPNo results for input(s): BNP, PROBNP in the last 168 hours.  DDimer No results for input(s): DDIMER in the last 168  hours.  Radiology/Studies:  Dg Chest 2 View  Result Date: 06/28/2017 CLINICAL DATA:  Chest pain. EXAM: CHEST  2 VIEW COMPARISON:  Radiograph of October 13, 2009. FINDINGS: The heart size and mediastinal contours are within normal limits. Both lungs are clear. The visualized skeletal structures are unremarkable. No pneumothorax or pleural effusion is noted. IMPRESSION: No active cardiopulmonary disease. Electronically Signed   By: Lupita Raider, M.D.  On: 06/28/2017 09:13    Assessment and Plan:   NSTEMI- Pt started having chest pain around 4 am, still having some pain in the ED. He is on Heparin, add IV NTG  CAD- RCA PCI with BMS in 2011 (no significant Lt system disease then). Negative but sub maximal treadmill March 2017.  HTN- Uncontrolled in ED  Smoker- 1/2 ppd smoker  Dyslipidemia- On no medication  Non compliance- Pt has been out of his medication for past few months  Depression- History from pt's wife suggestive of depression  Suspected sleep apnea- He was to have a sleep study in the past but this was never done.  Plan: MD to see- add IV NTG. Keep NPO (he is on the cath board pending MD review). Add statin and beta blocker. Check TSH. Consider adding ACE or ARB if needed for HTN if it does not come under control.   Severity of Illness: The appropriate patient status for this patient is INPATIENT. Inpatient status is judged to be reasonable and necessary in order to provide the required intensity of service to ensure the patient's safety. The patient's presenting symptoms, physical exam findings, and initial radiographic and laboratory data in the context of their chronic comorbidities is felt to place them at high risk for further clinical deterioration. Furthermore, it is not anticipated that the patient will be medically stable for discharge from the hospital within 2 midnights of admission. The following factors support the patient status of inpatient.   " The  patient's presenting symptoms include chest pain. " The worrisome physical exam findings include clear lungs. " The initial radiographic and laboratory data are worrisome because of elevated Troponin. " The chronic co-morbidities include known CAD, HTN, non complaince.   * I certify that at the point of admission it is my clinical judgment that the patient will require inpatient hospital care spanning beyond 2 midnights from the point of admission due to high intensity of service, high risk for further deterioration and high frequency of surveillance required.*    For questions or updates, please contact [facility] Please consult [email or web address] for contact info under Cardiology/STEMI.    Jolene ProvostSigned, Luke Kilroy, PA-C  06/28/2017 10:15 AM   Patient seen, examined. Available data reviewed. Agree with findings, assessment, and plan as outlined by Corine ShelterLuke Kilroy, PA-C.  The patient is independently interviewed and examined distress.  He is now chest pain-free on IV nitroglycerin and IV heparin.  JVP is normal.  Carotid upstrokes are normal without bruits.  Lung fields are clear.  Heart is regular rate and rhythm with no murmur lungs, nontender.  Extremities show no edema.  The right radial pulses 2+.  Initial troponin is positive.  EKG shows normal sinus rhythm without any acute ST/T changes.  The patient has a typical history of acute coronary syndrome/non-STEMI with a background of known coronary artery disease and previous bare-metal stenting of the right coronary artery.  Cardiac catheterization/PCI is clearly indicated. I have reviewed the risks, indications, and alternatives to cardiac catheterization, possible angioplasty, and stenting with the patient. Risks include but are not limited to bleeding, infection, vascular injury, stroke, myocardial infection, arrhythmia, kidney injury, radiation-related injury in the case of prolonged fluoroscopy use, emergency cardiac surgery, and death. The patient  understands the risks of serious complication is 1-2 in 1000 with diagnostic cardiac cath and 1-2% or less with angioplasty/stenting. I have reviewed this with the patient and his wife who is at the bedside. Will request Dr Katrinka BlazingSmith who is  his primary cardiologist.   We also discussed the importance of medication adherence and tobacco cessation, both of which are significant issues.   Tonny Bollman, M.D. 06/28/2017 11:39 AM

## 2017-06-28 NOTE — CV Procedure (Signed)
   Eccentric hazy appearing 85% stenosis in the distal RCA beyond the previously placed stent.  Mid RCA stent diffuse 30% narrowing.  Luminal irregularities noted in the LAD and circumflex.  Successful stent in the distal RCA reducing 85% stenosis to 0% with a 3.5 x 18 Onyx DES deployed it at 14 atm.  Normal left ventricular function.

## 2017-06-28 NOTE — Interval H&P Note (Signed)
Cath Lab Visit (complete for each Cath Lab visit)  Clinical Evaluation Leading to the Procedure:   ACS: Yes.    Non-ACS:    Anginal Classification: CCS IV  Anti-ischemic medical therapy: Minimal Therapy (1 class of medications)  Non-Invasive Test Results: No non-invasive testing performed  Prior CABG: No previous CABG      History and Physical Interval Note:  06/28/2017 6:04 PM  [name] [name]  has presented today for surgery, with the diagnosis of cp  The various methods of treatment have been discussed with the patient and family. After consideration of risks, benefits and other options for treatment, the patient has consented to  Procedure(s): LEFT HEART CATH AND CORONARY ANGIOGRAPHY (N/A) as a surgical intervention .  The patient's history has been reviewed, patient examined, no change in status, stable for surgery.  I have reviewed the patient's chart and labs.  Questions were answered to the patient's satisfaction.     Lyn Records III

## 2017-06-28 NOTE — ED Notes (Signed)
Paging cardiology regarding patient's increased troponin.

## 2017-06-29 ENCOUNTER — Telehealth: Payer: Self-pay | Admitting: Nurse Practitioner

## 2017-06-29 ENCOUNTER — Encounter (HOSPITAL_COMMUNITY): Payer: Self-pay | Admitting: Interventional Cardiology

## 2017-06-29 LAB — COMPREHENSIVE METABOLIC PANEL
ALBUMIN: 3.6 g/dL (ref 3.5–5.0)
ALK PHOS: 50 U/L (ref 38–126)
ALT: 27 U/L (ref 17–63)
ANION GAP: 9 (ref 5–15)
AST: 36 U/L (ref 15–41)
BUN: 10 mg/dL (ref 6–20)
CALCIUM: 9 mg/dL (ref 8.9–10.3)
CO2: 23 mmol/L (ref 22–32)
CREATININE: 0.88 mg/dL (ref 0.61–1.24)
Chloride: 105 mmol/L (ref 101–111)
GFR calc Af Amer: >60 mL/min (ref >60)
GFR calc non Af Amer: >60 mL/min (ref >60)
GLUCOSE: 95 mg/dL (ref 65–99)
Potassium: 3.8 mmol/L (ref 3.5–5.1)
SODIUM: 137 mmol/L (ref 135–145)
Total Bilirubin: 0.8 mg/dL (ref 0.3–1.2)
Total Protein: 6.6 g/dL (ref 6.5–8.1)

## 2017-06-29 LAB — HIV ANTIBODY (ROUTINE TESTING W REFLEX): HIV Screen 4th Generation wRfx: NONREACTIVE

## 2017-06-29 LAB — LIPID PANEL
Cholesterol: 175 mg/dL (ref 0–200)
HDL: 23 mg/dL — ABNORMAL LOW (ref >40)
LDL Cholesterol: 102 mg/dL — ABNORMAL HIGH (ref 0–99)
Total CHOL/HDL Ratio: 7.6 RATIO
Triglycerides: 248 mg/dL — ABNORMAL HIGH (ref <150)
VLDL: 50 mg/dL — ABNORMAL HIGH (ref 0–40)

## 2017-06-29 LAB — CBC
HCT: 47.3 % (ref 39.0–52.0)
Hemoglobin: 16.4 g/dL (ref 13.0–17.0)
MCH: 30.7 pg (ref 26.0–34.0)
MCHC: 34.7 g/dL (ref 30.0–36.0)
MCV: 88.4 fL (ref 78.0–100.0)
PLATELETS: 195 10*3/uL (ref 150–400)
RBC: 5.35 MIL/uL (ref 4.22–5.81)
RDW: 13.4 % (ref 11.5–15.5)
WBC: 8 10*3/uL (ref 4.0–10.5)

## 2017-06-29 MED ORDER — INFLUENZA VAC SPLIT QUAD 0.5 ML IM SUSY
0.5000 mL | PREFILLED_SYRINGE | INTRAMUSCULAR | Status: AC
  Administered 2017-06-29: 11:00:00 0.5 mL via INTRAMUSCULAR

## 2017-06-29 MED ORDER — ATORVASTATIN CALCIUM 80 MG PO TABS: 80.0000 mg | ORAL_TABLET | Freq: Every day | ORAL | 1 refills | Status: DC

## 2017-06-29 MED ORDER — TICAGRELOR 90 MG PO TABS: 90.0000 mg | ORAL_TABLET | Freq: Two times a day (BID) | ORAL | 1 refills | Status: DC

## 2017-06-29 MED ORDER — METOPROLOL TARTRATE 25 MG PO TABS
25.0000 mg | ORAL_TABLET | Freq: Two times a day (BID) | ORAL | Status: DC
  Administered 2017-06-29: 09:00:00 25 mg via ORAL
  Filled 2017-06-29: qty 1

## 2017-06-29 MED ORDER — TICAGRELOR 90 MG PO TABS: 90.0000 mg | ORAL_TABLET | Freq: Two times a day (BID) | ORAL | 0 refills | Status: DC

## 2017-06-29 MED ORDER — METOPROLOL TARTRATE 25 MG PO TABS: 25.0000 mg | ORAL_TABLET | Freq: Two times a day (BID) | ORAL | 1 refills | Status: DC

## 2017-06-29 MED ORDER — NITROGLYCERIN 0.4 MG SL SUBL: 0.4000 mg | SUBLINGUAL_TABLET | SUBLINGUAL | 2 refills | Status: DC | PRN

## 2017-06-29 MED ORDER — PNEUMOCOCCAL VAC POLYVALENT 25 MCG/0.5ML IJ INJ
0.5000 mL | INJECTION | INTRAMUSCULAR | Status: AC
  Administered 2017-06-29: 0.5 mL via INTRAMUSCULAR
  Filled 2017-06-29: qty 0.5

## 2017-06-29 NOTE — Progress Notes (Signed)
#  1. S/W MARIA @ [facility] # [phone]    BRILINTA 90 MG BID   COVER- YES  CO-PAY- $ 250.00  TIER- 2 DRUG  PRIOR APPROVAL - NO   DEDUCTIBLE NOT MET   PREFERRED PHARMACY : [facility]

## 2017-06-29 NOTE — Telephone Encounter (Signed)
Patient calling the office for samples of medication:   1.  What medication and dosage are you requesting samples for? Brilinta-just starting on them-Case Manager from [facility] was calling for him-Please call asap,pt is being discharged shortly  2.  Are you currently out of this medication?

## 2017-06-29 NOTE — Progress Notes (Signed)
Dr Santiago Glad text paged about [name] [name] 6C04's  Pauses. Most are 2.1-2.45. Most recent one was 4.31. Pt asleep. Asymptomatic.

## 2017-06-29 NOTE — Progress Notes (Signed)
CARDIAC REHAB PHASE I   PRE:  Rate/Rhythm: 85 SR few PVCs  BP:  Supine:   Sitting:   Standing: 131/71   SaO2: 95%RA  MODE:  Ambulation: 800 ft   POST:  Rate/Rhythm: 81 SR  BP:  Supine:   Sitting:   Standing: 147/75   SaO2: 97%RA 0825-0920 Pt walked 800 ft on RA with steady gait. Tolerated well. No CP. MI education completed with pt and wife who voiced understanding. Stressed importance of brilinta with stent. Has seen case Production designer, theatre/television/film. Reviewed NTG use, MI restrictions, ex ed, heart healthy food choices, risk factors, smoking cessation and CRP 2. Gave pt fake cigarette and smoking cessation handout. Pt plans to ask Dr Katrinka Blazing for Chantix prescription. Encouraged to call [phone] as needed. Discussed CRP 2 and will refer to [place]. Pt cannot attend now with work schedule, but this may be changing in a few months.    Luetta Nutting, RN BSN  06/29/2017 9:16 AM

## 2017-06-29 NOTE — Progress Notes (Signed)
Progress Note  Patient Name: [name] [name] [id number] Date of Encounter: 06/29/2017  Primary Cardiologist: Tamala Julian  Subjective   No recurrent chest pain with walking this am.  Inpatient Medications    Scheduled Meds: . aspirin  81 mg Oral Daily  . atorvastatin  80 mg Oral q1800  . heparin  5,000 Units Subcutaneous Q8H  . metoprolol tartrate  12.5 mg Oral BID  . pantoprazole  40 mg Oral Daily  . sodium chloride flush  3 mL Intravenous Q12H  . ticagrelor  90 mg Oral BID   Continuous Infusions: . sodium chloride    . sodium chloride     PRN Meds: sodium chloride, sodium chloride, acetaminophen, albuterol, ALPRAZolam, fluticasone, nitroGLYCERIN, ondansetron (ZOFRAN) IV, oxyCODONE, sodium chloride flush, zolpidem   Vital Signs    Vitals:   06/29/17 0030 06/29/17 0100 06/29/17 0400 06/29/17 0630  BP: (!) 159/68 135/84 121/75 (!) 126/50  Pulse: 65 62 67 81  Resp: (!) 25 11 (!) 22 (!) 28  Temp:   97.7 F (36.5 C)   TempSrc:   Oral   SpO2: 92% 97% 93% 97%  Weight:      Height:        Intake/Output Summary (Last 24 hours) at 06/29/17 0752 Last data filed at 06/29/17 0646  Gross per 24 hour  Intake           742.36 ml  Output             1500 ml  Net          -757.64 ml    I/O since admission: -[place]   06/28/17 0900 06/28/17 1340 06/28/17 1940  Weight: 235 lb (106.6 kg) 253 lb 8 oz (115 kg) 251 lb 12.3 oz (114.2 kg)    Telemetry    Sinus in the 80s - Personally Reviewed  ECG    ECG (independently read by me): NSR at 68 without STT changes  Physical Exam   BP (!) 126/50 (BP Location: Left Arm)   Pulse 81   Temp 97.7 F (36.5 C) (Oral)   Resp (!) 28   Ht '5\' 9"'  (1.753 m)   Wt 251 lb 12.3 oz (114.2 kg)   SpO2 97%   BMI 37.18 kg/m  General: Alert, oriented, no distress.  Skin: normal turgor, no rashes, warm and dry HEENT: Normocephalic, atraumatic. Pupils equal round and reactive to light; sclera anicteric; extraocular muscles intact;  Nose  without nasal septal hypertrophy Mouth/Parynx benign; Mallinpatti scale Neck: Thick neck; No JVD, no carotid bruits; normal carotid upstroke Lungs: clear to ausculatation and percussion; no wheezing or rales Chest wall: without tenderness to palpitation Heart: PMI not displaced, RRR, s1 s2 normal, 1/6 systolic murmur, no diastolic murmur, no rubs, gallops, thrills, or heaves Abdomen: soft, nontender; no hepatosplenomehaly, BS+; abdominal aorta nontender and not dilated by palpation. Back: no CVA tenderness Pulses 2+: R radial cath site stable Musculoskeletal: full range of motion, normal strength, no joint deformities Extremities: no clubbing cyanosis or edema, Homan's sign negative  Neurologic: grossly nonfocal; Cranial nerves grossly wnl Psychologic: Normal mood and affect   Labs    Chemistry Recent Labs Lab 06/28/17 0831 06/29/17 0504  NA 138 137  K 4.1 3.8  CL 107 105  CO2 21* 23  GLUCOSE 109* 95  BUN 12 10  CREATININE 0.91 0.88  CALCIUM 9.2 9.0  PROT  --  6.6  ALBUMIN  --  3.6  AST  --  36  ALT  --  27  ALKPHOS  --  50  BILITOT  --  0.8  GFRNONAA >60 >60  GFRAA >60 >60  ANIONGAP 10 9     Hematology Recent Labs Lab 06/28/17 0831 06/29/17 0504  WBC 8.4 8.0  RBC 5.54 5.35  HGB 16.7 16.4  HCT 49.0 47.3  MCV 88.4 88.4  MCH 30.1 30.7  MCHC 34.1 34.7  RDW 13.5 13.4  PLT 190 195    Cardiac Enzymes Recent Labs Lab 06/28/17 0913 06/28/17 1408 06/28/17 2016  TROPONINI 0.38* 6.13* 6.17*    Recent Labs Lab 06/28/17 0851  TROPIPOC 0.42*     BNPNo results for input(s): BNP, PROBNP in the last 168 hours.   DDimer No results for input(s): DDIMER in the last 168 hours.   Lipid Panel     Component Value Date/Time   CHOL 175 06/29/2017 0504   TRIG 248 (H) 06/29/2017 0504   HDL 23 (L) 06/29/2017 0504   CHOLHDL 7.6 06/29/2017 0504   VLDL 50 (H) 06/29/2017 0504   LDLCALC 102 (H) 06/29/2017 0504    Radiology    Dg Chest 2 View  Result Date:  06/28/2017 CLINICAL DATA:  Chest pain. EXAM: CHEST  2 VIEW COMPARISON:  Radiograph of October 13, 2009. FINDINGS: The heart size and mediastinal contours are within normal limits. Both lungs are clear. The visualized skeletal structures are unremarkable. No pneumothorax or pleural effusion is noted. IMPRESSION: No active cardiopulmonary disease. Electronically Signed   By: Marijo Conception, M.D.   On: 06/28/2017 09:13    Cardiac Studies   Cath/PCI Conclusion    Eccentric 85% thrombus containing distal RCA lesion beyond the previously placed mid RCA stent.  The previously placed stent contains diffuse 30-40% narrowing.  Widely patent left main.  Irregularities throughout the proximal to mid LAD with up to 60% in the mid vessel.  The first diagonal contains 40% ostial proximal narrowing.  40% proximal circumflex narrowing.  Reduction in 85% distal RCA stenosis to 0% with TIMI grade III flow following 3.5 x 18 Onyx DES deployed at 14 atm.  Post PCI there is eccentric 50% ostial RCA narrowing either related to coronary spasm or trauma from guide catheter.  No evidence of reduced flow or dissection.  Low normal LV function with EF 50-55%.  LVEDP 10 mmHg.  RECOMMENDATIONS:   Aspirin and Brilinta for 12 months.  Aggressive guideline based risk factor modification: A1c less than 7, LDL less than 70, blood pressure 130/85 mmHg or less, smoking cessation all discussed with patient.  Eligible for discharge tomorrow if no complications arise.     Patient Profile     [name] [name] is a 61 y.o. male with a history of CAD and prior PCI who presented to the ED with chest pain 06/28/17 worrisome for Unstable angina.   Assessment & Plan    1. NSTEMI : s/p PCI to distal RCA beyond previously placed mid RCA stent with excellent result. Trop 6.17 yesterday.  ECG normal today. Ambulating without recurrent chest pain.  Increase metoprolol for improved beta blockade to 25 mg bid today. On  ASA/brilinta for a minimum of 1 year.  2. Concomitant CAD; will increase metoprolol  3. HLD:  Increase statin now atorvastatin 80 mg with LDL 102.   4. Tobacco abuse; discussed cessation  5. Suspect OSA, consider outpatient evaluation.  Will ambulate this am; as long as remains stable, probable DC this afternoon.   Signed, Troy Sine, MD, [facility]  06/29/2017, 7:52 AM

## 2017-06-29 NOTE — Discharge Summary (Signed)
Discharge Summary    Patient ID: [name] [name],  MRN: [id number], DOB/AGE: 1956/05/17 61 y.o.  Admit date: 06/28/2017 Discharge date: 06/29/2017  Primary Care Provider: Sharilyn Sites Primary Cardiologist: Tamala Julian   Discharge Diagnoses    Principal Problem:   NSTEMI (non-ST elevated myocardial infarction) [facility]) Active Problems:   Hypertension   CAD S/P percutaneous coronary angioplasty   Dyslipidemia   Snoring   Smoker   Noncompliance with medication regimen   Allergies No Known Allergies  Diagnostic Studies/Procedures    Cath: 06/28/17  Conclusion    Eccentric 85% thrombus containing distal RCA lesion beyond the previously placed mid RCA stent.  The previously placed stent contains diffuse 30-40% narrowing.  Widely patent left main.  Irregularities throughout the proximal to mid LAD with up to 60% in the mid vessel.  The first diagonal contains 40% ostial proximal narrowing.  40% proximal circumflex narrowing.  Reduction in 85% distal RCA stenosis to 0% with TIMI grade III flow following 3.5 x 18 Onyx DES deployed at 14 atm.  Post PCI there is eccentric 50% ostial RCA narrowing either related to coronary spasm or trauma from guide catheter.  No evidence of reduced flow or dissection.  Low normal LV function with EF 50-55%.  LVEDP 10 mmHg.  RECOMMENDATIONS:   Aspirin and Brilinta for 12 months.  Aggressive guideline based risk factor modification: A1c less than 7, LDL less than 70, blood pressure 130/85 mmHg or less, smoking cessation all discussed with patient.  Eligible for discharge tomorrow if no complications arise.  _____________   History of Present Illness     [name] [name] is a married 61 y/o male followed by Dr Tamala Julian with a history of CAD. He had a NSTEMI in 2011 treated with BMS to the RCA. He had a non diagnostic treadmill in March 2017. He saw Richardson Dopp in Feb this year with complaints of dyspnea. An echo was ordered but he never got this  done. He stated he had been out of all his medications for some time now, no real explanation "just ran out". His wife reported she has noticed increased dyspnea with exertion, listlessness, and decreased interaction.  The pt was on his way to [facility] the morning of admission for work when he developed Lt sided chest pain with radiation to his left shoulder. He said it was similar, though not as severe as his MI pain back in 2011. [place] border and drove himself to the cardiology office where he was given SL NTG and EMS was called. He continued to have SSCP 2-3/10. His initial Troponin is 0.42, EKG shows no acute changes. Given these findings he was admitted and sent for cardiac catheterization.   Hospital Course     Started on IV heparin and nitro. Underwent cardiac cath noted above with PCI to the dRCA beyond the previously placed mRCA stent. Trop peaked at 6.17. Plan for DAPT with ASA/Brilinta. His metoprolol was increase to 88m BID this admission. Statin increased to 849mdaily. LDL 102. Post cath labs were stable with Cr 0.88 and Hgb 16.4. Walked with cardiac rehab without any chest pain. Of note, consider outpatient work up for OSA. Of note, CM discussed with patient regarding Brilinta, cost is around $250. He will be given samples but will likely need to be switched to plavix at follow up appt given cost.   [name] [name] seen by Dr. KeClaiborne Billingsnd determined stable for discharge home. Follow  up in the office has been arranged. Medications are listed below.   _____________  Discharge Vitals Blood pressure 131/78, pulse 72, temperature 97.7 F (36.5 C), temperature source Oral, resp. rate 16, height '5\' 9"'  (1.753 m), weight 251 lb 12.3 oz (114.2 kg), SpO2 96 %.  Filed Weights   06/28/17 0900 06/28/17 1340 06/28/17 1940  Weight: 235 lb (106.6 kg) 253 lb 8 oz (115 kg) 251 lb 12.3 oz (114.2 kg)    Labs & Radiologic Studies    CBC  Recent Labs  06/28/17 0831  06/29/17 0504  WBC 8.4 8.0  HGB 16.7 16.4  HCT 49.0 47.3  MCV 88.4 88.4  PLT 190 161   Basic Metabolic Panel  Recent Labs  06/28/17 0831 06/29/17 0504  NA 138 137  K 4.1 3.8  CL 107 105  CO2 21* 23  GLUCOSE 109* 95  BUN 12 10  CREATININE 0.91 0.88  CALCIUM 9.2 9.0   Liver Function Tests  Recent Labs  06/29/17 0504  AST 36  ALT 27  ALKPHOS 50  BILITOT 0.8  PROT 6.6  ALBUMIN 3.6   No results for input(s): LIPASE, AMYLASE in the last 72 hours. Cardiac Enzymes  Recent Labs  06/28/17 0913 06/28/17 1408 06/28/17 2016  TROPONINI 0.38* 6.13* 6.17*   BNP Invalid input(s): POCBNP D-Dimer No results for input(s): DDIMER in the last 72 hours. Hemoglobin A1C  Recent Labs  06/28/17 1057  HGBA1C 5.6   Fasting Lipid Panel  Recent Labs  06/29/17 0504  CHOL 175  HDL 23*  LDLCALC 102*  TRIG 248*  CHOLHDL 7.6   Thyroid Function Tests  Recent Labs  06/28/17 1057  TSH 2.451   _____________  Dg Chest 2 View  Result Date: 06/28/2017 CLINICAL DATA:  Chest pain. EXAM: CHEST  2 VIEW COMPARISON:  Radiograph of October 13, 2009. FINDINGS: The heart size and mediastinal contours are within normal limits. Both lungs are clear. The visualized skeletal structures are unremarkable. No pneumothorax or pleural effusion is noted. IMPRESSION: No active cardiopulmonary disease. Electronically Signed   By: Marijo Conception, M.D.   On: 06/28/2017 09:13   Disposition   Pt is being discharged home today in good condition.  Follow-up Plans & Appointments    Follow-up Information    Sharilyn Sites, MD Follow up.   Specialty:  Family Medicine Contact information: [address] [place] [address] [phone]        Burtis Junes, NP Follow up on 07/13/2017.   Specialties:  Nurse Practitioner, Interventional Cardiology, Cardiology, Radiology Why:  at 11:30am for your follow up appt.  Contact information: [place] [place]  [phone]          Discharge Instructions    Call MD for:  redness, tenderness, or signs of infection (pain, swelling, redness, odor or green/yellow discharge around incision site)    Complete by:  As directed    Diet - low sodium heart healthy    Complete by:  As directed    Discharge instructions    Complete by:  As directed    Radial Site Care Refer to this sheet in the next few weeks. These instructions provide you with information on caring for yourself after your procedure. Your caregiver may also give you more specific instructions. Your treatment has been planned according to current medical practices, but problems sometimes occur. Call your caregiver if you have any problems or questions after your procedure. HOME CARE INSTRUCTIONS  You may shower the day after the procedure.Remove the bandage (dressing) and gently wash the site with plain soap and water.Gently pat the site dry.  Do not apply powder or lotion to the site.  Do not submerge the affected site in water for 3 to 5 days.  Inspect the site at least twice daily.  Do not flex or bend the affected arm for 24 hours.  No lifting over 5 pounds (2.3 kg) for 5 days after your procedure.  Do not drive home if you are discharged the same day of the procedure. Have someone else drive you.  You may drive 24 hours after the procedure unless otherwise instructed by your caregiver.  What to expect: Any bruising will usually fade within 1 to 2 weeks.  Blood that collects in the tissue (hematoma) may be painful to the touch. It should usually decrease in size and tenderness within 1 to 2 weeks.  SEEK IMMEDIATE MEDICAL CARE IF: You have unusual pain at the radial site.  You have redness, warmth, swelling, or pain at the radial site.  You have drainage (other than a small amount of blood on the dressing).  You have chills.  You have a fever or persistent symptoms for more than 72 hours.  You have a fever and your symptoms  suddenly get worse.  Your arm becomes pale, cool, tingly, or numb.  You have heavy bleeding from the site. Hold pressure on the site.   PLEASE DO NOT MISS ANY DOSES OF YOUR BRILINTA!!!!! Also keep a log of you blood pressures and bring back to your follow up appt. Please call the office with any questions.   Patients taking blood thinners should generally stay away from medicines like ibuprofen, Advil, Motrin, naproxen, and Aleve due to risk of stomach bleeding. You may take Tylenol as directed or talk to your primary doctor about alternatives.   Increase activity slowly    Complete by:  As directed       Discharge Medications     Medication List    STOP taking these medications   sildenafil 50 MG tablet Commonly known as:  VIAGRA     TAKE these medications   aspirin EC 81 MG tablet Take 81 mg by mouth daily. What changed:  Another medication with the same name was removed. Continue taking this medication, and follow the directions you see here.   atorvastatin 80 MG tablet Commonly known as:  LIPITOR Take 1 tablet (80 mg total) by mouth daily at 6 PM. What changed:  medication strength  how much to take  when to take this   fenofibrate 48 MG tablet Commonly known as:  TRICOR TAKE ONE (1) TABLET BY MOUTH EVERY DAY   fluticasone 0.05 % cream Commonly known as:  CUTIVATE Apply 1 application topically daily as needed. (rash)   fluticasone 50 MCG/ACT nasal spray Commonly known as:  FLONASE Place 2 sprays into both nostrils daily as needed.   ketoconazole 2 % cream Commonly known as:  NIZORAL Apply 1 application topically daily as needed. (skin rash)   metoprolol tartrate 25 MG tablet Commonly known as:  LOPRESSOR Take 1 tablet (25 mg total) by mouth 2 (two) times daily.   nitroGLYCERIN 0.4 MG SL tablet Commonly known as:  NITROSTAT Place 1 tablet (0.4 mg total) under the tongue every 5 (five) minutes x 3 doses as needed for chest pain.   ticagrelor 90 MG Tabs  tablet Commonly known as:  BRILINTA Take 1 tablet (  90 mg total) by mouth 2 (two) times daily.        Aspirin prescribed at discharge?  Yes High Intensity Statin Prescribed? (Lipitor 40-42m or Crestor 20-439m: Yes Beta Blocker Prescribed? Yes For EF <40%, was ACEI/ARB Prescribed? No: EF ok ADP Receptor Inhibitor Prescribed? (i.e. Plavix etc.-Includes Medically Managed Patients): Yes For EF <40%, Aldosterone Inhibitor Prescribed? No: EF ok Was EF assessed during THIS hospitalization? Yes Was Cardiac Rehab II ordered? (Included Medically managed Patients): Yes   Outstanding Labs/Studies   FLP/LFTs in 6 weeks if tolerating statin.   Duration of Discharge Encounter   Greater than 30 minutes including physician time.  Signed, LiReino BellisP-C 06/29/2017, 9:03 AM

## 2017-06-29 NOTE — Telephone Encounter (Signed)
Spoke with case manager and she stated that the patient is on his way to pick up samples. Patient has already been given a co-pay card. Samples placed at the front desk.

## 2017-06-29 NOTE — Progress Notes (Signed)
TR BAND REMOVAL  LOCATION:    R radial  DEFLATED PER PROTOCOL:    YES  TIME BAND OFF / DRESSING APPLIED: 23:40  SITE UPON ARRIVAL:    Level 0   SITE AFTER BAND REMOVAL:    Level 0  CIRCULATION SENSATION AND MOVEMENT:    Within Normal Limits   YES  COMMENTS:   Pt tolerated procedure well VSS. Post TR Band removal instructions given. RUE elevated on pillow. Will continue to monitor.

## 2017-06-29 NOTE — Care Management Note (Signed)
Case Management Note  Patient Details  Name: [name] [name] MRN: [id number] Date of Birth: July 08, 1956  Subjective/Objective:  From home with wife, pta indep, s/p coronary stent intervention, will be on Brilinta, NCM gave patient the $5 co pay card.  He states he will be going to the [employer] [facility], but they do not have brilinta in stock. NCM awaiting benefit check, benefit check is 250.00 co pay.  Patient will go to MD office to get samples, at follow up he will need to be changed to something else.  Lillia Abed Pa notified.                 Action/Plan:   Expected Discharge Date:  06/29/17               Expected Discharge Plan:  Home/Self Care  In-House Referral:     Discharge planning Services  CM Consult  Post Acute Care Choice:    Choice offered to:     DME Arranged:    DME Agency:     HH Arranged:    HH Agency:     Status of Service:  Completed, signed off  If discussed at [employer] of Stay Meetings, dates discussed:    Additional Comments:  Leone Haven, RN 06/29/2017, 10:00 AM

## 2017-07-13 ENCOUNTER — Encounter: Payer: Self-pay | Admitting: Nurse Practitioner

## 2017-07-13 ENCOUNTER — Ambulatory Visit (INDEPENDENT_AMBULATORY_CARE_PROVIDER_SITE_OTHER): Payer: Commercial Managed Care - PPO | Admitting: Nurse Practitioner

## 2017-07-13 VITALS — BP 100/60 | HR 93 | Ht 69.0 in | Wt 254.8 lb

## 2017-07-13 DIAGNOSIS — Z955 Presence of coronary angioplasty implant and graft: Secondary | ICD-10-CM | POA: Diagnosis not present

## 2017-07-13 MED ORDER — METOPROLOL TARTRATE 25 MG PO TABS: 25.0000 mg | ORAL_TABLET | Freq: Two times a day (BID) | ORAL | 3 refills | Status: DC

## 2017-07-13 MED ORDER — ATORVASTATIN CALCIUM 80 MG PO TABS: 80.0000 mg | ORAL_TABLET | Freq: Every day | ORAL | 3 refills | Status: DC

## 2017-07-13 MED ORDER — TICAGRELOR 90 MG PO TABS: 90.0000 mg | ORAL_TABLET | Freq: Two times a day (BID) | ORAL | 11 refills | Status: DC

## 2017-07-13 NOTE — Progress Notes (Signed)
   CARDIOLOGY OFFICE NOTE  Date:  07/13/2017    [name] [name] Date of Birth: 09/05/1955 Medical Record #[id number]  PCP:  Golding, John, MD  Cardiologist:  [facility]  Chief Complaint  Patient presents with  . Coronary Artery Disease    Post hospital visit - seen for Dr. Smith    History of Present Illness: [name] [name] is a 61 y.o. male who presents today for a post hospital visit. Seen for Dr. Smith.   He has a history of CAD. He had a NSTEMI in 2011 treated with BMS to the RCA. He had a non diagnostic treadmill in March 2017. He saw Scott Weaver in February of this year with complaints of dyspnea. An echo was ordered but he never got this done. He stated he had been out of all his medications for some time now, no real explanation "just ran out". His wife reported she has noticed increased dyspnea with exertion, listlessness, and decreased interaction.  The pt was on his way to [place] the morning of admission for work when he developed Lt sided chest pain with radiation to his left shoulder. He said it was similar, though not as severe as his MI pain back in 2011. [place] and [place] himself to the cardiology office where he was given SL NTG and EMS was called. He continued to have SSCP 2-3/10. His initial Troponin is 0.42, EKG shows no acute changes. Given these findings he was admitted and sent for cardiac catheterization.   Started on IV heparin and nitro. Underwent cardiac cath as noted below with PCI to the dRCA beyond the previously placed mRCA stent. Trop peaked at 6.17. Plan for DAPT with ASA/Brilinta. His metoprolol was increase to 25mg BID. Statin increased to 80mg daily.  Of note, was to consider outpatient work up for OSA. Of note, CM discussed with patient regarding Brilinta, cost is around $250. He will be given samples but will likely need to be switched to Plavix at follow up appt given cost.   Comes in today. Here with his wife.  Several concerns noted. Has ran out of fenofibrate - not taking for the past few weeks. Says he is "evil" - trying to stop smoking. Still smoking - but less - just "a few puffs" on 3 to 4 cigs per day - he is using Nicoderm patches. He is on Brilinta - has had some shortness of breath - "he is working thru". This has improved. No more chest pain. Seems to have more energy. He is not interested in cardiac rehab due to traveling for work. He has been more active since discharge. No problems with his cath site. Asking about alcohol. Wife notes that he has had prior tendency in the past to have significant alcohol use.   Past Medical History:  Diagnosis Date  . Coronary artery disease    10/18 PCI/DESx1 dRCA, patent mRCA stent, EF 50-55%  . Hypercholesteremia   . Hypertension   . Myocardial infarction (HCC)    10/14/2009    Past Surgical History:  Procedure Laterality Date  . CARDIAC CATHETERIZATION     10/14/2012-with stent  . CATARACT EXTRACTION, BILATERAL     [facility]  . CORONARY ANGIOPLASTY  10/15/2011   1 stent     Medications: Current Meds  Medication Sig  . aspirin EC 81 MG tablet Take 81 mg by mouth daily.  . atorvastatin (LIPITOR) 80 MG tablet Take 1   tablet (80 mg total) daily at 6 PM by mouth.  . fluticasone (CUTIVATE) 0.05 % cream Apply 1 application topically daily as needed. (rash)  . fluticasone (FLONASE) 50 MCG/ACT nasal spray Place 2 sprays into both nostrils daily as needed.  Marland Kitchen ketoconazole (NIZORAL) 2 % cream Apply 1 application topically daily as needed. (skin rash)  . metoprolol tartrate (LOPRESSOR) 25 MG tablet Take 1 tablet (25 mg total) 2 (two) times daily by mouth.  . nicotine (NICODERM CQ - DOSED IN MG/24 HOURS) 21 mg/24hr patch Place 21 mg daily onto the skin.  Marland Kitchen nitroGLYCERIN (NITROSTAT) 0.4 MG SL tablet Place 1 tablet (0.4 mg total) under the tongue every 5 (five) minutes x 3 doses as needed for chest pain.  . ticagrelor (BRILINTA) 90 MG TABS tablet  Take 1 tablet (90 mg total) 2 (two) times daily by mouth.  . [DISCONTINUED] atorvastatin (LIPITOR) 80 MG tablet Take 1 tablet (80 mg total) by mouth daily at 6 PM.  . [DISCONTINUED] metoprolol tartrate (LOPRESSOR) 25 MG tablet Take 1 tablet (25 mg total) by mouth 2 (two) times daily.  . [DISCONTINUED] ticagrelor (BRILINTA) 90 MG TABS tablet Take 1 tablet (90 mg total) by mouth 2 (two) times daily.     Allergies: No Known Allergies  Social History: The patient  reports that he has been smoking cigarettes.  He has a 15.00 pack-year smoking history. he has never used smokeless tobacco. He reports that he drinks about 4.2 oz of alcohol per week. He reports that he does not use drugs.   Family History: The patient's family history includes CAD in his mother; Cancer in his father; Coronary artery disease (age of onset: 74) in his brother; Stroke (age of onset: 44) in his brother.   Review of Systems: Please see the history of present illness.   Otherwise, the review of systems is positive for none.   All other systems are reviewed and negative.   Physical Exam: VS:  BP 100/60 (BP Location: Left Arm, Patient Position: Sitting, Cuff Size: Large)   Pulse 93   Ht 5' 9" (1.753 m)   Wt 254 lb 12.8 oz (115.6 kg)   SpO2 94% Comment: at rest  BMI 37.63 kg/m  .  BMI Body mass index is 37.63 kg/m.  Wt Readings from Last 3 Encounters:  07/13/17 254 lb 12.8 oz (115.6 kg)  06/28/17 251 lb 12.3 oz (114.2 kg)  10/05/16 254 lb (115.2 kg)    General: Pleasant. Obese male. Alert and in no acute distress.   HEENT: Normal. Face is ruddy.  Neck: Supple, no JVD, carotid bruits, or masses noted.  Cardiac: Regular rate and rhythm. No murmurs, rubs, or gallops. No edema.  Respiratory:  Lungs are clear to auscultation bilaterally with normal work of breathing.  GI: Soft and nontender.  MS: No deformity or atrophy. Gait and ROM intact.  Skin: Warm and dry. Color is normal.  Neuro:  Strength and sensation  are intact and no gross focal deficits noted.  Psych: Alert, appropriate and with normal affect.   LABORATORY DATA:  EKG:  EKG is not ordered today.  Lab Results  Component Value Date   WBC 8.0 06/29/2017   HGB 16.4 06/29/2017   HCT 47.3 06/29/2017   PLT 195 06/29/2017   GLUCOSE 95 06/29/2017   CHOL 175 06/29/2017   TRIG 248 (H) 06/29/2017   HDL 23 (L) 06/29/2017   LDLCALC 102 (H) 06/29/2017   ALT 27 06/29/2017   AST 36 06/29/2017  NA 137 06/29/2017   K 3.8 06/29/2017   CL 105 06/29/2017   CREATININE 0.88 06/29/2017   BUN 10 06/29/2017   CO2 23 06/29/2017   TSH 2.451 06/28/2017   INR 1.02 06/28/2017   HGBA1C 5.6 06/28/2017     BNP (last 3 results) No results for input(s): BNP in the last 8760 hours.  ProBNP (last 3 results) No results for input(s): PROBNP in the last 8760 hours.   Other Studies Reviewed Today:  Cath: 06/28/17  Conclusion    Eccentric 85% thrombus containing distal RCA lesion beyond the previously placed mid RCA stent. The previously placed stent contains diffuse 30-40% narrowing.  Widely patent left main.  Irregularities throughout the proximal to mid LAD with up to 60% in the mid vessel. The first diagonal contains 40% ostial proximal narrowing.  40% proximal circumflex narrowing.  Reduction in 85% distal RCA stenosis to 0% with TIMI grade III flow following 3.5 x 18 Onyx DES deployed at 14 atm. Post PCI there is eccentric 50% ostial RCA narrowing either related to coronary spasm or trauma from guide catheter. No evidence of reduced flow or dissection.  Low normal LV function with EF 50-55%. LVEDP 10 mmHg.  RECOMMENDATIONS:   Aspirin and Brilinta for 12 months.  Aggressive guideline based risk factor modification: A1c less than 7, LDL less than 70, blood pressure 130/85 mmHg or less, smoking cessation all discussed with patient.  Eligible for discharge tomorrow if no complications arise.    Assessment/Plan:  1. CAD -  recent NSTEMI - with PCI to the RCA - on DAPT - did have some Brilinta related dyspnea - this has improved with time. No further chest pain. Needs CV risk factor modification which has been discussed at length - needs to really start making his health a priority.   2. HLD - now on higher dose statin - will need labs on return for recheck. Refilled his fenofibrate today. I suspect his history of elevated triglycerides is more diet related.   3. HTN - BP ok on current regimen. No changes made.   4. Obesity - discussed weighs to work on his habits.   5. Smoking cessation - he is smoking less - total cessation encouraged.   Current medicines are reviewed with the patient today.  The patient does not have concerns regarding medicines other than what has been noted above.  The following changes have been made:  See above.  Labs/ tests ordered today include:    Orders Placed This Encounter  Procedures  . Basic metabolic panel  . CBC     Disposition:   FU with Dr. Tamala Julian in about 6 to 8 weeks with fasting labs.    Patient is agreeable to this plan and will call if any problems develop in the interim.   SignedTruitt Merle, NP  07/13/2017 12:31 PM  [facility] [address] [place], [place]  [address] Phone: [phone] Fax: [phone]

## 2017-07-13 NOTE — Patient Instructions (Addendum)
We will be checking the following labs today - BMET and CBC   Medication Instructions:    Continue with your current medicines.   Restart your fenofibrate - this has been sent in to your pharmacy.   I refilled the Metoprolol and Lipitor today  I am giving you a paper RX for the Brilinta    Testing/Procedures To Be Arranged:  N/A  Follow-Up:   See Dr. Katrinka Blazing in about 6 to 8 weeks with fasting labs     Other Special Instructions:   Think about what we talked about today.     If you need a refill on your cardiac medications before your next appointment, please call your pharmacy.   Call the [facility] office at [phone] if you have any questions, problems or concerns.

## 2017-07-14 LAB — BASIC METABOLIC PANEL
BUN/Creatinine Ratio: 9 — ABNORMAL LOW (ref 10–24)
BUN: 9 mg/dL (ref 8–27)
CO2: 24 mmol/L (ref 20–29)
Calcium: 9.7 mg/dL (ref 8.6–10.2)
Chloride: 104 mmol/L (ref 96–106)
Creatinine, Ser: 0.98 mg/dL (ref 0.76–1.27)
GFR calc Af Amer: 96 mL/min/{1.73_m2} (ref >59)
GFR calc non Af Amer: 83 mL/min/{1.73_m2} (ref >59)
Glucose: 70 mg/dL (ref 65–99)
Potassium: 4.6 mmol/L (ref 3.5–5.2)
Sodium: 142 mmol/L (ref 134–144)

## 2017-07-14 LAB — CBC
Hematocrit: 45.8 % (ref 37.5–51.0)
Hemoglobin: 16.2 g/dL (ref 13.0–17.7)
MCH: 30.9 pg (ref 26.6–33.0)
MCHC: 35.4 g/dL (ref 31.5–35.7)
MCV: 87 fL (ref 79–97)
Platelets: 252 10*3/uL (ref 150–379)
RBC: 5.25 x10E6/uL (ref 4.14–5.80)
RDW: 13.8 % (ref 12.3–15.4)
WBC: 10.7 10*3/uL (ref 3.4–10.8)

## 2017-08-25 DIAGNOSIS — E7800 Pure hypercholesterolemia, unspecified: Secondary | ICD-10-CM | POA: Insufficient documentation

## 2017-08-25 DIAGNOSIS — I2510 Atherosclerotic heart disease of native coronary artery without angina pectoris: Secondary | ICD-10-CM | POA: Insufficient documentation

## 2017-08-25 DIAGNOSIS — I219 Acute myocardial infarction, unspecified: Secondary | ICD-10-CM | POA: Insufficient documentation

## 2017-09-14 ENCOUNTER — Encounter: Payer: Self-pay | Admitting: Interventional Cardiology

## 2017-09-14 ENCOUNTER — Encounter (INDEPENDENT_AMBULATORY_CARE_PROVIDER_SITE_OTHER): Payer: Self-pay

## 2017-09-14 ENCOUNTER — Ambulatory Visit (INDEPENDENT_AMBULATORY_CARE_PROVIDER_SITE_OTHER): Payer: Commercial Managed Care - PPO | Admitting: Interventional Cardiology

## 2017-09-14 VITALS — BP 118/72 | HR 88 | Ht 69.0 in | Wt 253.2 lb

## 2017-09-14 DIAGNOSIS — Z9114 Patient's other noncompliance with medication regimen: Secondary | ICD-10-CM

## 2017-09-14 DIAGNOSIS — E785 Hyperlipidemia, unspecified: Secondary | ICD-10-CM

## 2017-09-14 DIAGNOSIS — I2510 Atherosclerotic heart disease of native coronary artery without angina pectoris: Secondary | ICD-10-CM | POA: Diagnosis not present

## 2017-09-14 DIAGNOSIS — I10 Essential (primary) hypertension: Secondary | ICD-10-CM

## 2017-09-14 DIAGNOSIS — F17200 Nicotine dependence, unspecified, uncomplicated: Secondary | ICD-10-CM

## 2017-09-14 LAB — COMPREHENSIVE METABOLIC PANEL
ALT: 23 IU/L (ref 0–44)
AST: 20 IU/L (ref 0–40)
Albumin/Globulin Ratio: 1.7 (ref 1.2–2.2)
Albumin: 4.4 g/dL (ref 3.6–4.8)
Alkaline Phosphatase: 53 IU/L (ref 39–117)
BUN/Creatinine Ratio: 12 (ref 10–24)
BUN: 14 mg/dL (ref 8–27)
Bilirubin Total: 0.5 mg/dL (ref 0.0–1.2)
CALCIUM: 9.7 mg/dL (ref 8.6–10.2)
CO2: 20 mmol/L (ref 20–29)
Chloride: 104 mmol/L (ref 96–106)
Creatinine, Ser: 1.17 mg/dL (ref 0.76–1.27)
GFR calc Af Amer: 77 mL/min/{1.73_m2} (ref >59)
GFR, EST NON AFRICAN AMERICAN: 67 mL/min/{1.73_m2} (ref >59)
Globulin, Total: 2.6 g/dL (ref 1.5–4.5)
Glucose: 110 mg/dL — ABNORMAL HIGH (ref 65–99)
POTASSIUM: 4.3 mmol/L (ref 3.5–5.2)
Sodium: 140 mmol/L (ref 134–144)
Total Protein: 7 g/dL (ref 6.0–8.5)

## 2017-09-14 LAB — LIPID PANEL
CHOLESTEROL TOTAL: 121 mg/dL (ref 100–199)
Chol/HDL Ratio: 4.8 ratio (ref 0.0–5.0)
HDL: 25 mg/dL — AB (ref >39)
LDL Calculated: 55 mg/dL (ref 0–99)
TRIGLYCERIDES: 206 mg/dL — AB (ref 0–149)
VLDL Cholesterol Cal: 41 mg/dL — ABNORMAL HIGH (ref 5–40)

## 2017-09-14 MED ORDER — CLOPIDOGREL BISULFATE 75 MG PO TABS: 75.0000 mg | ORAL_TABLET | Freq: Every day | ORAL | 3 refills | Status: DC

## 2017-09-14 MED ORDER — VARENICLINE TARTRATE 0.5 MG X 11 & 1 MG X 42 PO MISC: ORAL | 0 refills | Status: DC

## 2017-09-14 NOTE — Progress Notes (Signed)
Cardiology Office Note    Date:  09/14/2017   ID:  NAVI SUGUITAN, DOB Nov 17, 1955, MRN [id number]  PCP:  Assunta Found, MD  Cardiologist: Lesleigh Noe, MD   Chief Complaint  Patient presents with  . Coronary Artery Disease    History of Present Illness:  [name] [name] is a 62 y.o. male with a hx of CAD s/p NSTEMI in 2011 tx with BMS to the RCA, HTN, HL, obesity.  Last seen by Dr. Verdis Prime in 3/17.  He had a low risk ETT in 3/17 (did not achieve 85% PMHR).    Present in October 2018 with ACS and was found to have a de novo 95% stenosis in the distal RCA that was treated with drug-eluting stent.  He is now 2 months out.  He denies recurrent angina.  He is having dyspnea after taking Brilinta.  He denies orthopnea and lower extremity edema.  He is still smoking cigarettes.  He is not able to lose weight.   Past Medical History:  Diagnosis Date  . Coronary artery disease    10/18 PCI/DESx1 dRCA, patent mRCA stent, EF 50-55%  . Hypercholesteremia   . Hypertension   . Myocardial infarction [facility])    10/14/2009    Past Surgical History:  Procedure Laterality Date  . CARDIAC CATHETERIZATION     10/14/2012-with stent  . CATARACT EXTRACTION, BILATERAL     [place]  . CORONARY ANGIOPLASTY  10/15/2011   1 stent  . CORONARY STENT INTERVENTION N/A 06/28/2017   Procedure: CORONARY STENT INTERVENTION;  Surgeon: Lyn Records, MD;  Location: [facility] INVASIVE CV LAB;  Service: Cardiovascular;  Laterality: N/A;  . LEFT HEART CATH AND CORONARY ANGIOGRAPHY N/A 06/28/2017   Procedure: LEFT HEART CATH AND CORONARY ANGIOGRAPHY;  Surgeon: Lyn Records, MD;  Location: [facility] INVASIVE CV LAB;  Service: Cardiovascular;  Laterality: N/A;    Current Medications: Outpatient Medications Prior to Visit  Medication Sig Dispense Refill  . aspirin EC 81 MG tablet Take 81 mg by mouth daily.    Marland Kitchen atorvastatin (LIPITOR) 80 MG tablet Take 1 tablet (80 mg total) daily at 6 PM by mouth. 90  tablet 3  . fenofibrate (TRICOR) 48 MG tablet TAKE ONE (1) TABLET BY MOUTH EVERY DAY 30 tablet 8  . fluticasone (CUTIVATE) 0.05 % cream Apply 1 application topically daily as needed. (rash)  2  . fluticasone (FLONASE) 50 MCG/ACT nasal spray Place 2 sprays into both nostrils daily as needed.  2  . ketoconazole (NIZORAL) 2 % cream Apply 1 application topically daily as needed. (skin rash)  2  . metoprolol tartrate (LOPRESSOR) 25 MG tablet Take 1 tablet (25 mg total) 2 (two) times daily by mouth. 180 tablet 3  . nicotine (NICODERM CQ - DOSED IN MG/24 HOURS) 21 mg/24hr patch Place 21 mg daily onto the skin.    Marland Kitchen nitroGLYCERIN (NITROSTAT) 0.4 MG SL tablet Place 1 tablet (0.4 mg total) under the tongue every 5 (five) minutes x 3 doses as needed for chest pain. 25 tablet 2  . ticagrelor (BRILINTA) 90 MG TABS tablet Take 1 tablet (90 mg total) 2 (two) times daily by mouth. 60 tablet 11   No facility-administered medications prior to visit.      Allergies:   Patient has no known allergies.   Social History   Socioeconomic History  . Marital status: Married    Spouse name: None  . Number of children: None  . Years  of education: None  . Highest education level: None  Social Needs  . Financial resource strain: None  . Food insecurity - worry: None  . Food insecurity - inability: None  . Transportation needs - medical: None  . Transportation needs - non-medical: None  Occupational History  . None  Tobacco Use  . Smoking status: Light Tobacco Smoker    Packs/day: 0.50    Years: 30.00    Pack years: 15.00    Types: Cigarettes  . Smokeless tobacco: Never Used  Substance and Sexual Activity  . Alcohol use: Yes    Alcohol/week: 4.2 oz    Types: 5 Cans of beer, 2 Shots of liquor per week  . Drug use: No  . Sexual activity: Yes    Birth control/protection: None  Other Topics Concern  . None  Social History Narrative  . None     Family History:  The patient's family history includes CAD  in his mother; Cancer in his father; Coronary artery disease (age of onset: 70) in his brother; Stroke (age of onset: 65) in his brother.   ROS:   Please see the history of present illness.    Smoking cigarettes.  Gaining weight.  Shortness of breath after Brilinta. All other systems reviewed and are negative.   PHYSICAL EXAM:   VS:  BP 118/72   Pulse 88   Ht 5\' 9"  (1.753 m)   Wt 253 lb 3.2 oz (114.9 kg)   SpO2 98%   BMI 37.39 kg/m    GEN: Well nourished, well developed, in no acute distress .  Moderate obesity. HEENT: normal  Neck: no JVD, carotid bruits, or masses Cardiac: RRR; no murmurs, rubs, or gallops,no edema  Respiratory:  clear to auscultation bilaterally, normal work of breathing GI: soft, nontender, nondistended, + BS MS: no deformity or atrophy  Skin: warm and dry, no rash Neuro:  Alert and Oriented x 3, Strength and sensation are intact Psych: euthymic mood, full affect  Wt Readings from Last 3 Encounters:  09/14/17 253 lb 3.2 oz (114.9 kg)  07/13/17 254 lb 12.8 oz (115.6 kg)  06/28/17 251 lb 12.3 oz (114.2 kg)      Studies/Labs Reviewed:   EKG:  EKG  Not done  Recent Labs: 06/28/2017: TSH 2.451 06/29/2017: ALT 27 07/13/2017: BUN 9; Creatinine, Ser 0.98; Hemoglobin 16.2; Platelets 252; Potassium 4.6; Sodium 142   Lipid Panel    Component Value Date/Time   CHOL 175 06/29/2017 0504   TRIG 248 (H) 06/29/2017 0504   HDL 23 (L) 06/29/2017 0504   CHOLHDL 7.6 06/29/2017 0504   VLDL 50 (H) 06/29/2017 0504   LDLCALC 102 (H) 06/29/2017 0504    Additional studies/ records that were reviewed today include:  Echocardiogram ordered October 2018 but never followed up upon by the patient  Exercise treadmill test 11/25/15: Study Highlights    Blood pressure demonstrated a hypertensive response to exercise.  There was no ST segment deviation noted during stress.   Non diagnostic treadmill due to achieving only 77% of PMHR. Normal ECG response achieving 10  METS Consider f/u imaging or pharmacologic study to definitively r/o CAD   Cardiac catheterization with PCI 2018 Coronary Diagrams   Diagnostic Diagram       Post-Intervention Diagram          ASSESSMENT:    1. Coronary artery disease involving native coronary artery of native heart without angina pectoris   2. Dyslipidemia   3. Noncompliance with medication regimen  4. Essential hypertension   5. Smoker      PLAN:  In order of problems listed above:  1. Right coronary has 2 stents.  He is stable without angina.  Having dyspnea with Brilinta.  Will switch to clopidogrel 75 mg/day in 1 month.  He will start this by taken to 75 mg clopidogrel with his last Brilinta tablet.  Clopidogrel will be continued to complete the one year of dual antiplatelet therapy.  Clinical follow-up in 6 months. 2. Lipid and liver panel today. 3. Compliance with medical therapy since October. 4. Excellent blood pressure control. 5. Chantix is prescribed to help him discontinue smoking.  39-month clinical follow-up, lipid panel today, switch to clopidogrel from Brilinta.  Medication Adjustments/Labs and Tests Ordered: Current medicines are reviewed at length with the patient today.  Concerns regarding medicines are outlined above.  Medication changes, Labs and Tests ordered today are listed in the Patient Instructions below. Patient Instructions  Medication Instructions:  1) DISCONTINUE Brilinta when you run out of your current supply, then 2) START Plavix 75mg  once daily.  You will take your first dose of Plavix with your last dose of Brilitna. 3) START Chantix as directed  Labwork: CMET and Lipids today  Testing/Procedures: None  Follow-Up: Your physician wants you to follow-up in: 6 months with Dr. Katrinka Blazing.  You will receive a reminder letter in the mail two months in advance. If you don't receive a letter, please call our office to schedule the follow-up appointment.   Any Other  Special Instructions Will Be Listed Below (If Applicable).     If you need a refill on your cardiac medications before your next appointment, please call your pharmacy.      Signed, Lesleigh Noe, MD  09/14/2017 8:19 AM    [facility] [address] [place], [place], [place]  [address] Phone: [phone]; Fax: [phone]

## 2017-09-14 NOTE — Patient Instructions (Signed)
Medication Instructions:  1) DISCONTINUE Brilinta when you run out of your current supply, then 2) START Plavix 75mg  once daily.  You will take your first dose of Plavix with your last dose of Brilitna. 3) START Chantix as directed  Labwork: CMET and Lipids today  Testing/Procedures: None  Follow-Up: Your physician wants you to follow-up in: 6 months with Dr. Katrinka Blazing.  You will receive a reminder letter in the mail two months in advance. If you don't receive a letter, please call our office to schedule the follow-up appointment.   Any Other Special Instructions Will Be Listed Below (If Applicable).     If you need a refill on your cardiac medications before your next appointment, please call your pharmacy.

## 2017-12-02 ENCOUNTER — Telehealth: Payer: Self-pay | Admitting: Interventional Cardiology

## 2017-12-02 NOTE — Telephone Encounter (Signed)
New message  1. What dental office are you calling from? Dr Memory Argue  2. What is your office phone and fax number? Phone [phone]  3. What type of procedure is the patient having performed? Dental extraction  4. What date is procedure scheduled or is the patient there now? 12/02/2017  5. What is your question (ex. Antibiotics prior to procedure, holding medication-we need to know how long dentist wants pt to hold med)? Plavix

## 2017-12-02 NOTE — Telephone Encounter (Signed)
Spoke with Dr. Katrinka Blazing and he said ok to hold Plavix and ASA if needed but not necessary for one tooth extraction.  Faxed to requesting office

## 2017-12-17 ENCOUNTER — Other Ambulatory Visit: Payer: Self-pay | Admitting: Cardiology

## 2017-12-30 ENCOUNTER — Other Ambulatory Visit: Payer: Self-pay | Admitting: Interventional Cardiology

## 2017-12-30 DIAGNOSIS — E785 Hyperlipidemia, unspecified: Secondary | ICD-10-CM

## 2018-01-05 ENCOUNTER — Telehealth: Payer: Self-pay | Admitting: Interventional Cardiology

## 2018-01-05 NOTE — Telephone Encounter (Signed)
It would be optimal to continue Plavix until 1 year out from implant October 2019.  Holding for 5 days at this point, now beyond 6 months, places him at a slightly high risk of stent thrombosis.

## 2018-01-05 NOTE — Telephone Encounter (Signed)
New Message      [facility] Pre-operative Risk Assessment    Request for surgical clearance:  1. What type of surgery is being performed? implants  2. When is this surgery scheduled? 02/28/18  3. What type of clearance is required (medical clearance vs. Pharmacy clearance to hold med vs. Both)? Pharmacy  4. Are there any medications that need to be held prior to surgery and how long? Plavix for 5 days prior and wants to know when can he resume?  5. Practice name and name of physician performing surgery? [facility]   and dental implants/ Dr. Chanetta Marshall  6. What is your office phone number [phone]   7.   What is your office fax number [phone]  8.   Anesthesia type (None, local, MAC, general) ? Iv sedation   Nicholes Stairs 01/05/2018, 3:45 PM  _________________________________________________________________   (provider comments below)

## 2018-01-05 NOTE — Telephone Encounter (Signed)
   Primary Cardiologist:Henry W Leia Alf, MD  Will route to Dr. Katrinka Blazing for recommendations regarding Plavix.  Dentist specifically asking to hold Plavix for 5 days for dental implant.  Patient is < 12 mos out from NSTEMI.  Chart reviewed as part of pre-operative protocol coverage.   62 year old male with a history of coronary disease status post non-ST elevation MI in 2011 treated with a bare-metal stent to the RCA.  He was admitted in October 2018 with non-ST elevation MI which was treated with a drug-eluting stent to the distal RCA.  He was last seen by Dr. Katrinka Blazing Jan 2019.  Tereso Newcomer, PA-C  01/05/2018, 4:12 PM

## 2018-01-06 NOTE — Telephone Encounter (Signed)
I will efax Dr. Michaelle Copas recommendations, attached to the requesting provider.

## 2018-02-04 ENCOUNTER — Encounter: Payer: Self-pay | Admitting: Interventional Cardiology

## 2018-02-04 ENCOUNTER — Encounter (INDEPENDENT_AMBULATORY_CARE_PROVIDER_SITE_OTHER): Payer: Self-pay

## 2018-02-04 ENCOUNTER — Ambulatory Visit (INDEPENDENT_AMBULATORY_CARE_PROVIDER_SITE_OTHER): Payer: Commercial Managed Care - PPO | Admitting: Interventional Cardiology

## 2018-02-04 VITALS — BP 106/66 | HR 76 | Ht 69.0 in | Wt 264.0 lb

## 2018-02-04 DIAGNOSIS — I10 Essential (primary) hypertension: Secondary | ICD-10-CM | POA: Diagnosis not present

## 2018-02-04 DIAGNOSIS — I2510 Atherosclerotic heart disease of native coronary artery without angina pectoris: Secondary | ICD-10-CM

## 2018-02-04 DIAGNOSIS — E785 Hyperlipidemia, unspecified: Secondary | ICD-10-CM

## 2018-02-04 NOTE — Patient Instructions (Signed)
Medication Instructions:  Your physician recommends that you continue on your current medications as directed. Please refer to the Current Medication list given to you today.  Labwork: You will need fasting labs at your next appointment.  So please schedule next appointment in the morning and have nothing to eat or drink after midnight except water or black coffee.   Testing/Procedures: None  Follow-Up: Your physician wants you to follow-up in: 6 months with Dr. Katrinka Blazing.  You will receive a reminder letter in the mail two months in advance. If you don't receive a letter, please call our office to schedule the follow-up appointment.   Any Other Special Instructions Will Be Listed Below (If Applicable).     If you need a refill on your cardiac medications before your next appointment, please call your pharmacy.

## 2018-02-04 NOTE — Progress Notes (Signed)
Cardiology Office Note    Date:  02/04/2018   ID:  [name] [name], DOB Sep 12, 1955, MRN [id number]  PCP:  Assunta Found, MD  Cardiologist: Lesleigh Noe, MD   Chief Complaint  Patient presents with  . Coronary Artery Disease    History of Present Illness:  [name] [name] is a 62 y.o. male  with a hx of CAD s/p NSTEMI in 2011 tx with BMS to the RCA, HTN, HL, obesity.  Last seen by Dr. Verdis Prime in 3/17.  He had a low risk ETT in 3/17 (did not achieve 85% PMHR).    Present in October 2018 with ACS and was found to have a de novo 95% stenosis in the distal RCA that was treated with drug-eluting stent.  He is now 8 months out.   Denies angina.  Has resumed work as a Teacher, early years/pre.  Says he got depressed after the most recent stent back in October because his job ended and he did not have good income.  Started smoking again.  Has significantly decreased alcohol intake.  Gaining weight.   Past Medical History:  Diagnosis Date  . Coronary artery disease    10/18 PCI/DESx1 dRCA, patent mRCA stent, EF 50-55%  . Hypercholesteremia   . Hypertension   . Myocardial infarction [facility])    10/14/2009    Past Surgical History:  Procedure Laterality Date  . CARDIAC CATHETERIZATION     10/14/2012-with stent  . CATARACT EXTRACTION, [place]ILATERAL     [place]  . CORONARY ANGIOPLASTY  10/15/2011   1 stent  . CORONARY STENT INTERVENTION N/A 06/28/2017   Procedure: CORONARY STENT INTERVENTION;  Surgeon: Lyn Records, MD;  Location: [facility] INVASIVE CV LAB;  Service: Cardiovascular;  Laterality: N/A;  . LEFT HEART CATH AND CORONARY ANGIOGRAPHY N/A 06/28/2017   Procedure: LEFT HEART CATH AND CORONARY ANGIOGRAPHY;  Surgeon: Lyn Records, MD;  Location: [facility] INVASIVE CV LAB;  Service: Cardiovascular;  Laterality: N/A;    Current Medications: Outpatient Medications Prior to Visit  Medication Sig Dispense Refill  . aspirin EC 81 MG tablet Take 81 mg by mouth daily.    Marland Kitchen atorvastatin  (LIPITOR) 80 MG tablet TAKE 1 TABLET BY MOUTH EVERY DAY AT 6 PM 90 tablet 2  . clopidogrel (PLAVIX) 75 MG tablet Take 1 tablet (75 mg total) by mouth daily. 90 tablet 3  . fenofibrate (TRICOR) 48 MG tablet TAKE 1 TABLET BY MOUTH EVERY DAY 30 tablet 6  . fluticasone (CUTIVATE) 0.05 % cream Apply 1 application topically daily as needed. (rash)  2  . fluticasone (FLONASE) 50 MCG/ACT nasal spray Place 2 sprays into both nostrils daily as needed for allergies.   2  . ketoconazole (NIZORAL) 2 % cream Apply 1 application topically daily as needed. (skin rash)  2  . metoprolol tartrate (LOPRESSOR) 25 MG tablet Take 1 tablet (25 mg total) 2 (two) times daily by mouth. 180 tablet 3  . nitroGLYCERIN (NITROSTAT) 0.4 MG SL tablet Place 1 tablet (0.4 mg total) under the tongue every 5 (five) minutes x 3 doses as needed for chest pain. 25 tablet 2  . nicotine (NICODERM CQ - DOSED IN MG/24 HOURS) 21 mg/24hr patch Place 21 mg daily onto the skin.    Marland Kitchen varenicline (CHANTIX STARTING MONTH PAK) 0.5 MG X 11 & 1 MG X 42 tablet Take one 0.5 mg tablet by mouth once daily for 3 days, then increase to one 0.5 mg tablet twice daily  for 4 days, then increase to one 1 mg tablet twice daily. (Patient not taking: Reported on 02/04/2018) 53 tablet 0   No facility-administered medications prior to visit.      Allergies:   Patient has no known allergies.   Social History   Socioeconomic History  . Marital status: Married    Spouse name: Not on file  . Number of children: Not on file  . Years of education: Not on file  . Highest education level: Not on file  Occupational History  . Not on file  Social Needs  . Financial resource strain: Not on file  . Food insecurity:    Worry: Not on file    Inability: Not on file  . Transportation needs:    Medical: Not on file    Non-medical: Not on file  Tobacco Use  . Smoking status: Light Tobacco Smoker    Packs/day: 0.50    Years: 30.00    Pack years: 15.00    Types:  Cigarettes  . Smokeless tobacco: Never Used  Substance and Sexual Activity  . Alcohol use: Yes    Alcohol/week: 4.2 oz    Types: 5 Cans of beer, 2 Shots of liquor per week  . Drug use: No  . Sexual activity: Yes    Birth control/protection: None  Lifestyle  . Physical activity:    Days per week: Not on file    Minutes per session: Not on file  . Stress: Not on file  Relationships  . Social connections:    Talks on phone: Not on file    Gets together: Not on file    Attends religious service: Not on file    Active member of club or organization: Not on file    Attends meetings of clubs or organizations: Not on file    Relationship status: Not on file  Other Topics Concern  . Not on file  Social History Narrative  . Not on file     Family History:  The patient's family history includes CAD in his mother; Cancer in his father; Coronary artery disease (age of onset: 42) in his brother; Stroke (age of onset: 23) in his brother.   ROS:   Please see the history of present illness.    No complaints.  New job in [place] which he feels may last several years and be the job that he will retire from. All other systems reviewed and are negative.   PHYSICAL EXAM:   VS:  BP 106/66   Pulse 76   Ht 5\' 9"  (1.753 m)   Wt 264 lb (119.7 kg)   BMI 38.99 kg/m    GEN: Well nourished, well developed, in no acute distress  HEENT: normal  Neck: no JVD, carotid bruits, or masses Cardiac: RRR; no murmurs, rubs, or gallops,no edema  Respiratory:  clear to auscultation bilaterally, normal work of breathing GI: soft, nontender, nondistended, + BS MS: no deformity or atrophy  Skin: warm and dry, no rash Neuro:  Alert and Oriented x 3, Strength and sensation are intact Psych: euthymic mood, full affect  Wt Readings from Last 3 Encounters:  02/04/18 264 lb (119.7 kg)  09/14/17 253 lb 3.2 oz (114.9 kg)  07/13/17 254 lb 12.8 oz (115.6 kg)      Studies/Labs Reviewed:   EKG:  EKG   none  Recent Labs: 06/28/2017: TSH 2.451 07/13/2017: Hemoglobin 16.2; Platelets 252 09/14/2017: ALT 23; BUN 14; Creatinine, Ser 1.17; Potassium 4.3; Sodium 140  Lipid Panel    Component Value Date/Time   CHOL 121 09/14/2017 0822   TRIG 206 (H) 09/14/2017 0822   HDL 25 (L) 09/14/2017 0822   CHOLHDL 4.8 09/14/2017 0822   CHOLHDL 7.6 06/29/2017 0504   VLDL 50 (H) 06/29/2017 0504   LDLCALC 55 09/14/2017 4098    Additional studies/ records that were reviewed today include:  None    ASSESSMENT:    1. Coronary artery disease involving native coronary artery of native heart without angina pectoris   2. Dyslipidemia   3. Essential hypertension      PLAN:  In order of problems listed above:  1. Doing well without angina.  Not exercising.  No medication side effects.  Breathing okay.  I encouraged 150 minutes of aerobic activity per week.  I encouraged smoking cessation.  He is smoking more than 7 cigarettes/day.  Last LDL was less than 78 months ago.  Blood pressure is excellent. 2. Recheck lipid panel with a comprehensive metabolic panel on return in 6 months. 3. Target blood pressure 130/80 mmHg.  No change in current therapy.  Strongly encouraged smoking cessation and aerobic activity of moderate intensity totaling 150 minutes/week.  Clinical follow-up in 6 months.    Medication Adjustments/Labs and Tests Ordered: Current medicines are reviewed at length with the patient today.  Concerns regarding medicines are outlined above.  Medication changes, Labs and Tests ordered today are listed in the Patient Instructions below. Patient Instructions  Medication Instructions:  Your physician recommends that you continue on your current medications as directed. Please refer to the Current Medication list given to you today.  Labwork: You will need fasting labs at your next appointment.  So please schedule next appointment in the morning and have nothing to eat or drink after midnight  except water or black coffee.   Testing/Procedures: None  Follow-Up: Your physician wants you to follow-up in: 6 months with Dr. Katrinka Blazing.  You will receive a reminder letter in the mail two months in advance. If you don't receive a letter, please call our office to schedule the follow-up appointment.   Any Other Special Instructions Will Be Listed Below (If Applicable).     If you need a refill on your cardiac medications before your next appointment, please call your pharmacy.      Signed, Lesleigh Noe, MD  02/04/2018 11:18 AM    [facility] [address] [place], [place], [place]  [address] Phone: [phone]; Fax: [phone]

## 2018-07-18 ENCOUNTER — Other Ambulatory Visit: Payer: Self-pay | Admitting: Nurse Practitioner

## 2018-08-11 ENCOUNTER — Other Ambulatory Visit: Payer: Self-pay | Admitting: Interventional Cardiology

## 2018-09-15 ENCOUNTER — Encounter: Payer: Self-pay | Admitting: Interventional Cardiology

## 2018-09-22 ENCOUNTER — Other Ambulatory Visit: Payer: Self-pay

## 2018-09-22 ENCOUNTER — Telehealth: Payer: Self-pay | Admitting: Interventional Cardiology

## 2018-09-22 DIAGNOSIS — E785 Hyperlipidemia, unspecified: Secondary | ICD-10-CM

## 2018-09-22 MED ORDER — FENOFIBRATE 48 MG PO TABS: 48.0000 mg | ORAL_TABLET | Freq: Every day | ORAL | 5 refills | Status: DC

## 2018-09-22 NOTE — Telephone Encounter (Signed)
Spoke with pt and advised we can do labs prior or he can come fasting to his appt and have labs drawn the same day.  Pt agreeable to come to appt fasting.

## 2018-09-22 NOTE — Telephone Encounter (Signed)
New Message:     Pt says he is scheduled to see Dr Katrinka Blazing on 10-03-18. His question is, does he need to have lab work prior to this office visit please?

## 2018-09-22 NOTE — Telephone Encounter (Signed)
°*  STAT* If patient is at the pharmacy, call can be transferred to refill team.   1. Which medications need to be refilled? (please list name of each medication and dose if known)  Fenofibrate  2. Which pharmacy/location (including street and city if local pharmacy) is medication to be sent to? [facility]- [address] , [place]  3. Do they need a 30 day or 90 day supply? 90 and refills

## 2018-09-26 ENCOUNTER — Other Ambulatory Visit: Payer: Self-pay | Admitting: Interventional Cardiology

## 2018-09-26 DIAGNOSIS — E785 Hyperlipidemia, unspecified: Secondary | ICD-10-CM

## 2018-09-26 MED ORDER — FENOFIBRATE 48 MG PO TABS
48.0000 mg | ORAL_TABLET | Freq: Every day | ORAL | 1 refills | Status: DC
Start: 2018-09-26 — End: 2019-03-22

## 2018-09-28 ENCOUNTER — Other Ambulatory Visit: Payer: Self-pay | Admitting: Interventional Cardiology

## 2018-10-01 NOTE — Progress Notes (Signed)
Cardiology Office Note:    Date:  10/03/2018   ID:  [name] [name], DOB 12-29-1955, MRN [id number]  PCP:  Sharilyn Sites, MD  Cardiologist:  Sinclair Grooms, MD   Referring MD: Sharilyn Sites, MD   Chief Complaint  Patient presents with  . Coronary Artery Disease  . Sleep Apnea  . Advice Only    History of Present Illness:    [name] [name] is a 63 y.o. male with a hx of  CAD s/p NSTEMI in 2011 tx with BMS to the RCA, HTN, HL, obesity. Last seen by Dr. Vassie Moselle 3/17. He had a low risk ETT in 3/17 (did not achieve 85% PMHR).Present in October 2018 with ACS and was found to have a de novo 95% stenosis in the distal RCA that was treated with drug-eluting stent.    He is doing well.  He was tested for sleep apnea and has severe OSA.  He is compliant with CPAP.  He feels much better.  Had gotten to the point that he could not sleep very much at home.  This is markedly improved.  He is not smoking.  He has had no significant chest discomfort.  No medication side effect.  No bleeding on dual antiplatelet therapy.  He is now attempting to lose weight.  He is working with the dietitian.  He was out of fenofibrate for several months.  He has been back on the medication for approximately 1 week.   Past Medical History:  Diagnosis Date  . Coronary artery disease    10/18 PCI/DESx1 dRCA, patent mRCA stent, EF 50-55%  . Hypercholesteremia   . Hypertension   . Myocardial infarction [facility])    10/14/2009    Past Surgical History:  Procedure Laterality Date  . CARDIAC CATHETERIZATION     10/14/2012-with stent  . CATARACT EXTRACTION, BILATERAL     [facility]  . CORONARY ANGIOPLASTY  10/15/2011   1 stent  . CORONARY STENT INTERVENTION N/A 06/28/2017   Procedure: CORONARY STENT INTERVENTION;  Surgeon: Belva Crome, MD;  Location: [facility];  Service: Cardiovascular;  Laterality: N/A;  . LEFT HEART CATH AND CORONARY ANGIOGRAPHY N/A 06/28/2017   Procedure: LEFT HEART CATH AND CORONARY ANGIOGRAPHY;  Surgeon: Belva Crome, MD;  Location: [facility];  Service: Cardiovascular;  Laterality: N/A;    Current Medications: Current Meds  Medication Sig  . atorvastatin (LIPITOR) 80 MG tablet TAKE 1 TABLET BY MOUTH EVERY DAY AT 6 PM  . clopidogrel (PLAVIX) 75 MG tablet TAKE 1 TABLET BY MOUTH EVERY DAY  . fenofibrate (TRICOR) 48 MG tablet Take 1 tablet (48 mg total) by mouth daily.  . fluticasone (CUTIVATE) 0.05 % cream Apply 1 application topically daily as needed. (rash)  . fluticasone (FLONASE) 50 MCG/ACT nasal spray Place 2 sprays into both nostrils daily as needed for allergies.   Marland Kitchen ketoconazole (NIZORAL) 2 % cream Apply 1 application topically daily as needed. (skin rash)  . metoprolol tartrate (LOPRESSOR) 25 MG tablet TAKE 1 TABLET BY MOUTH TWICE A DAY  . nitroGLYCERIN (NITROSTAT) 0.4 MG SL tablet Place 1 tablet (0.4 mg total) under the tongue every 5 (five) minutes x 3 doses as needed for chest pain.  . [DISCONTINUED] aspirin EC 81 MG tablet Take 81 mg by mouth daily.  . [DISCONTINUED] nitroGLYCERIN (NITROSTAT) 0.4 MG SL tablet Place 1 tablet (0.4 mg total) under the tongue every 5 (five) minutes x 3 doses as needed for  chest pain.     Allergies:   Patient has no known allergies.   Social History   Socioeconomic History  . Marital status: Married    Spouse name: Not on file  . Number of children: Not on file  . Years of education: Not on file  . Highest education level: Not on file  Occupational History  . Not on file  Social Needs  . Financial resource strain: Not on file  . Food insecurity:    Worry: Not on file    Inability: Not on file  . Transportation needs:    Medical: Not on file    Non-medical: Not on file  Tobacco Use  . Smoking status: Light Tobacco Smoker    Packs/day: 0.50    Years: 30.00    Pack years: 15.00    Types: Cigarettes  . Smokeless tobacco: Never Used  Substance and Sexual Activity    . Alcohol use: Yes    Alcohol/week: 7.0 standard drinks    Types: 5 Cans of beer, 2 Shots of liquor per week  . Drug use: No  . Sexual activity: Yes    Birth control/protection: None  Lifestyle  . Physical activity:    Days per week: Not on file    Minutes per session: Not on file  . Stress: Not on file  Relationships  . Social connections:    Talks on phone: Not on file    Gets together: Not on file    Attends religious service: Not on file    Active member of club or organization: Not on file    Attends meetings of clubs or organizations: Not on file    Relationship status: Not on file  Other Topics Concern  . Not on file  Social History Narrative  . Not on file     Family History: The patient's family history includes CAD in his mother; Cancer in his father; Coronary artery disease (age of onset: 16) in his brother; Stroke (age of onset: 34) in his brother. There is no history of Anesthesia problems, Hypotension, Malignant hyperthermia, or Pseudochol deficiency.  ROS:   Please see the history of present illness.    Sleep is significantly better.  He feels refreshed when he awakens.  He is compliant with CPAP.  His diet is not good.  His appetite is been uncontrollable since smoking cessation.  All other systems reviewed and are negative.  EKGs/Labs/Other Studies Reviewed:    The following studies were reviewed today:  Cardiac catheterization June 28, 2017:   Eccentric 85% thrombus containing distal RCA lesion beyond the previously placed mid RCA stent.  The previously placed stent contains diffuse 30-40% narrowing.  Widely patent left main.  Irregularities throughout the proximal to mid LAD with up to 60% in the mid vessel.  The first diagonal contains 40% ostial proximal narrowing.  40% proximal circumflex narrowing.  Reduction in 85% distal RCA stenosis to 0% with TIMI grade III flow following 3.5 x 18 Onyx DES deployed at 14 atm.  Post PCI there is eccentric  50% ostial RCA narrowing either related to coronary spasm or trauma from guide catheter.  No evidence of reduced flow or dissection.  Low normal LV function with EF 50-55%.  LVEDP 10 mmHg.  RECOMMENDATIONS:   Aspirin and Brilinta for 12 months.  Aggressive guideline based risk factor modification: A1c less than 7, LDL less than 70, blood pressure 130/85 mmHg or less, smoking cessation all discussed with patient.  Eligible for  discharge tomorrow if no complications arise.  EKG:  EKG performed on October 03, 2018 demonstrates normal sinus rhythm and no abnormality.  No change when compared to the prior tracing  Recent Labs: No results found for requested labs within last 8760 hours.  Recent Lipid Panel    Component Value Date/Time   CHOL 121 09/14/2017 0822   TRIG 206 (H) 09/14/2017 0822   HDL 25 (L) 09/14/2017 0822   CHOLHDL 4.8 09/14/2017 0822   CHOLHDL 7.6 06/29/2017 0504   VLDL 50 (H) 06/29/2017 0504   LDLCALC 55 09/14/2017 0822    Physical Exam:    VS:  BP 94/62   Pulse 65   Ht _0  (1.753 m)   Wt 266 lb 6.4 oz (120.8 kg)   SpO2 94%   BMI 39.34 kg/m     Wt Readings from Last 3 Encounters:  10/03/18 266 lb 6.4 oz (120.8 kg)  02/04/18 264 lb (119.7 kg)  09/14/17 253 lb 3.2 oz (114.9 kg)     GEN: Morbid obesity.. No acute distress HEENT: Normal NECK: No JVD. LYMPHATICS: No lymphadenopathy CARDIAC: RRR.  No murmur, no gallop, no edema VASCULAR: 2+ bilateral radial and carotid pulses, no bruits RESPIRATORY:  Clear to auscultation without rales, wheezing or rhonchi  ABDOMEN: Soft, non-tender, non-distended, No pulsatile mass, MUSCULOSKELETAL: No deformity  SKIN: Warm and dry NEUROLOGIC:  Alert and oriented x 3 PSYCHIATRIC:  Normal affect   ASSESSMENT:    1. Coronary artery disease involving native coronary artery of native heart without angina pectoris   2. Dyslipidemia   3. Essential hypertension   4. Tobacco abuse   5. Obstructive sleep apnea   6.  Morbid obesity ([place])    PLAN:    In order of problems listed above:  1. Patient is stable without angina.  Plan to down grade intensity of antiplatelet therapy by discontinuing aspirin.  We will continue Plavix as the long-term therapy of choice. 2. Aggressive lipid modification.  Laboratory work done 1 year ago was reasonable.  Labs have been obtained today and further adjustment will be made based upon findings.  Triglyceride was over 202,019.  May need to consider addition of VASCEPA discontinuation of TriCor. 3. Blood pressure is very well controlled.  No change in current regimen.  Antihypertensive effect is provided only by metoprolol tartrate 25 mg twice daily. 4. He continues to abstain from tobacco use.  Encouragement is given. 5. Continued compliance with CPAP.  The importance of sleep apnea control is discussed relative to prevention of subsequent cardiac events. 6. Weight loss with change in diet and exercise are discussed in detail.  Overall doing relatively well.  Prolonged office visit with discussion concerning risk modification: : LDL less than 70, hemoglobin A1c less than 7, blood pressure target less than 130/80 mmHg, >150 minutes of moderate aerobic activity per week, avoidance of smoking, weight control (via diet and exercise), and continued surveillance/management of/for obstructive sleep apnea.  Greater than 50% of the time during this office visit was spent in education, counseling, and coordination of care related to underlying disease process and testing as outlined.       Medication Adjustments/Labs and Tests Ordered: Current medicines are reviewed at length with the patient today.  Concerns regarding medicines are outlined above.  Orders Placed This Encounter  Procedures  . Lipid Profile  . Hepatic function panel  . Basic Metabolic Panel (BMET)  . EKG 12-Lead   Meds ordered this encounter  Medications  . nitroGLYCERIN (  NITROSTAT) 0.4 MG SL tablet     Sig: Place 1 tablet (0.4 mg total) under the tongue every 5 (five) minutes x 3 doses as needed for chest pain.    Dispense:  25 tablet    Refill:  2    Patient Instructions  Medication Instructions:  Your physician has recommended you make the following change in your medication:  STOP Aspirin    If you need a refill on your cardiac medications before your next appointment, please call your pharmacy.    Lab work: TODAY - cholesterol, liver panel, basic metabolic panel  If you have labs (blood work) drawn today and your tests are completely normal, you will receive your results only by: Marland Kitchen MyChart Message (if you have MyChart) OR . A paper copy in the mail If you have any lab test that is abnormal or we need to change your treatment, we will call you to review the results.   Testing/Procedures: Your provider recommends that you maintain 150 minutes per week of moderate aerobic activity. Walking, cycling, rowing   Follow-Up: At [facility], you and your health needs are our priority.  As part of our continuing mission to provide you with exceptional heart care, we have created designated Provider Care Teams.  These Care Teams include your primary Cardiologist (physician) and Advanced Practice Providers (APPs -  Physician Assistants and Nurse Practitioners) who all work together to provide you with the care you need, when you need it. You will need a follow up appointment in 1 year.  Please call our office 2 months in advance to schedule this appointment.  You may see Sinclair Grooms, MD or one of the following Advanced Practice Providers on your designated Care Team:   Truitt Merle, NP Cecilie Kicks, NP . Kathyrn Drown, NP      Signed, Sinclair Grooms, MD  10/03/2018 1:17 PM    [place]

## 2018-10-03 ENCOUNTER — Ambulatory Visit (INDEPENDENT_AMBULATORY_CARE_PROVIDER_SITE_OTHER): Payer: Commercial Managed Care - PPO | Admitting: Interventional Cardiology

## 2018-10-03 ENCOUNTER — Encounter: Payer: Self-pay | Admitting: Interventional Cardiology

## 2018-10-03 VITALS — BP 94/62 | HR 65 | Ht 69.0 in | Wt 266.4 lb

## 2018-10-03 DIAGNOSIS — Z720 Tobacco use: Secondary | ICD-10-CM

## 2018-10-03 DIAGNOSIS — I10 Essential (primary) hypertension: Secondary | ICD-10-CM

## 2018-10-03 DIAGNOSIS — E785 Hyperlipidemia, unspecified: Secondary | ICD-10-CM

## 2018-10-03 DIAGNOSIS — G4733 Obstructive sleep apnea (adult) (pediatric): Secondary | ICD-10-CM

## 2018-10-03 DIAGNOSIS — I2510 Atherosclerotic heart disease of native coronary artery without angina pectoris: Secondary | ICD-10-CM | POA: Diagnosis not present

## 2018-10-03 LAB — BASIC METABOLIC PANEL
BUN / CREAT RATIO: 12 (ref 10–24)
BUN: 13 mg/dL (ref 8–27)
CO2: 21 mmol/L (ref 20–29)
CREATININE: 1.1 mg/dL (ref 0.76–1.27)
Calcium: 9.8 mg/dL (ref 8.6–10.2)
Chloride: 102 mmol/L (ref 96–106)
GFR, EST AFRICAN AMERICAN: 83 mL/min/{1.73_m2} (ref >59)
GFR, EST NON AFRICAN AMERICAN: 72 mL/min/{1.73_m2} (ref >59)
Glucose: 112 mg/dL — ABNORMAL HIGH (ref 65–99)
POTASSIUM: 4.5 mmol/L (ref 3.5–5.2)
SODIUM: 139 mmol/L (ref 134–144)

## 2018-10-03 LAB — HEPATIC FUNCTION PANEL
ALBUMIN: 4.2 g/dL (ref 3.8–4.8)
ALK PHOS: 57 IU/L (ref 39–117)
ALT: 21 IU/L (ref 0–44)
AST: 12 IU/L (ref 0–40)
BILIRUBIN TOTAL: 0.4 mg/dL (ref 0.0–1.2)
BILIRUBIN, DIRECT: 0.14 mg/dL (ref 0.00–0.40)
TOTAL PROTEIN: 6.4 g/dL (ref 6.0–8.5)

## 2018-10-03 LAB — LIPID PANEL
CHOLESTEROL TOTAL: 115 mg/dL (ref 100–199)
Chol/HDL Ratio: 4.8 ratio (ref 0.0–5.0)
HDL: 24 mg/dL — ABNORMAL LOW (ref >39)
LDL Calculated: 50 mg/dL (ref 0–99)
Triglycerides: 204 mg/dL — ABNORMAL HIGH (ref 0–149)
VLDL Cholesterol Cal: 41 mg/dL — ABNORMAL HIGH (ref 5–40)

## 2018-10-03 MED ORDER — NITROGLYCERIN 0.4 MG SL SUBL: 0.4000 mg | SUBLINGUAL_TABLET | SUBLINGUAL | 2 refills | Status: DC | PRN

## 2018-10-03 NOTE — Patient Instructions (Signed)
Medication Instructions:  Your physician has recommended you make the following change in your medication:  STOP Aspirin    If you need a refill on your cardiac medications before your next appointment, please call your pharmacy.    Lab work: TODAY - cholesterol, liver panel, basic metabolic panel  If you have labs (blood work) drawn today and your tests are completely normal, you will receive your results only by: Marland Kitchen MyChart Message (if you have MyChart) OR . A paper copy in the mail If you have any lab test that is abnormal or we need to change your treatment, we will call you to review the results.   Testing/Procedures: Your provider recommends that you maintain 150 minutes per week of moderate aerobic activity. Walking, cycling, rowing   Follow-Up: At [facility], you and your health needs are our priority.  As part of our continuing mission to provide you with exceptional heart care, we have created designated Provider Care Teams.  These Care Teams include your primary Cardiologist (physician) and Advanced Practice Providers (APPs -  Physician Assistants and Nurse Practitioners) who all work together to provide you with the care you need, when you need it. You will need a follow up appointment in 1 year.  Please call our office 2 months in advance to schedule this appointment.  You may see Lesleigh Noe, MD or one of the following Advanced Practice Providers on your designated Care Team:   Norma Fredrickson, NP Nada Boozer, NP . Georgie Chard, NP

## 2018-10-05 ENCOUNTER — Telehealth: Payer: Self-pay | Admitting: *Deleted

## 2018-10-05 ENCOUNTER — Encounter: Payer: Self-pay | Admitting: *Deleted

## 2018-10-05 DIAGNOSIS — E785 Hyperlipidemia, unspecified: Secondary | ICD-10-CM

## 2018-10-05 DIAGNOSIS — E781 Pure hyperglyceridemia: Secondary | ICD-10-CM

## 2018-10-05 NOTE — Telephone Encounter (Signed)
-----   Message from Lyn Records, MD sent at 10/05/2018  1:31 PM EST ----- Let the patient know the triglyceride levels are still high.  Triglycerides carry cardiovascular risk.  I recommend that he add on Icosapent ethyl 2 g twice daily if he can get the medication.  Repeat lipid panel 6 months if he adds the medication as recommended. A copy will be sent to Assunta Found, MD

## 2018-10-05 NOTE — Telephone Encounter (Signed)
Spoke with pt and went over results and recommendations.  Pt states that he was off of his Fenofibrate for 6-8 weeks due to issues getting the refill request approved.  Pt has only been back on it for about 7 days.  Pt would like to continue the Fenofibrate and recheck labs in a couple of months and see how his numbers are.  Advised pt I will order labs and mail him the lab requisition sheets so he can go to the [facility] in [place] sometime in April to have labs drawn.  Pt verbalized understanding and was in agreement with this plan.

## 2018-12-21 ENCOUNTER — Other Ambulatory Visit: Payer: Self-pay | Admitting: Interventional Cardiology

## 2019-01-11 ENCOUNTER — Other Ambulatory Visit: Payer: Self-pay | Admitting: Nurse Practitioner

## 2019-01-17 ENCOUNTER — Other Ambulatory Visit: Payer: Self-pay | Admitting: Family Medicine

## 2019-01-17 DIAGNOSIS — M5412 Radiculopathy, cervical region: Secondary | ICD-10-CM

## 2019-01-24 ENCOUNTER — Ambulatory Visit (HOSPITAL_COMMUNITY)
Admission: RE | Admit: 2019-01-24 | Discharge: 2019-01-24 | Disposition: A | Discharge status: Home / Self Care | Payer: Commercial Managed Care - PPO | Source: Ambulatory Visit | Attending: Family Medicine | Admitting: Family Medicine

## 2019-01-24 ENCOUNTER — Other Ambulatory Visit: Payer: Self-pay

## 2019-01-24 DIAGNOSIS — M5412 Radiculopathy, cervical region: Secondary | ICD-10-CM

## 2019-01-30 ENCOUNTER — Telehealth: Payer: Self-pay | Admitting: *Deleted

## 2019-01-30 NOTE — Telephone Encounter (Signed)
   [facility] Pre-operative Risk Assessment    Request for surgical clearance:  1. What type of surgery is being performed? ANTERIOR CERVICAL FUSION  2. When is this surgery scheduled? TBD  3. What type of clearance is required (medical clearance vs. Pharmacy clearance to hold med vs. Both)? MEDICAL  4. Are there any medications that need to be held prior to surgery and how long? PLAVIX   5. Practice name and name of physician performing surgery? [facility]; DR. JEFFREY JENKINS  6.   What is your office phone number [phone]   7.   What is your office fax number [phone]  8.   Anesthesia type (None, local, MAC, general) ? GENERAL   Julaine Hua 01/30/2019, 9:43 AM  _________________________________________________________________   (provider comments below)

## 2019-01-30 NOTE — Telephone Encounter (Signed)
   Primary Cardiologist: Lesleigh Noe, MD  Chart reviewed as part of pre-operative protocol coverage. Patient was contacted 01/30/2019 in reference to pre-operative risk assessment for pending surgery as outlined below.  [name] [name] was last seen on 10/03/18. H/o CAD s/p NSTEMI s/p PCI to RCA 2011 and DES to dRCA 05/2017, HTN, HLD, obesity, severe OSA, obesity. Since that day, [name] [name] has done well without any CP, SOB, palpitations, syncope. He walks 2-3 miles at work daily and also climbs flight of steps without complication. RCRI 0.9% indicating low risk of CV complications. Therefore, based on ACC/AHA guidelines, the patient would be at acceptable risk for the planned procedure without further cardiovascular testing.   Will route message to Dr. Katrinka Blazing for input on holding Plavix (patient is on Plavix monotherapy at this point). Dr. Katrinka Blazing - Please route response to [facility] PREOP (the pre-op pool). Thank you.   Laurann Montana, PA-C 01/30/2019, 1:33 PM

## 2019-01-30 NOTE — Telephone Encounter (Signed)
Okay to hold at least 5 days. Okay to hold for 7 if needed.

## 2019-01-30 NOTE — Telephone Encounter (Signed)
   Primary Cardiologist: Lesleigh Noe, MD  Chart revisited as part of pre-operative protocol coverage. Given past medical history and time since last visit, based on ACC/AHA guidelines, [name] [name] would be at acceptable risk for the planned procedure without further cardiovascular testing.   Per Dr. Katrinka Blazing, regarding Plavix, "Okay to hold at least 5 days. Okay to hold for 7 if needed."  I will route this recommendation to the requesting party via Epic fax function. Will remove from pre-op pool.  Please call with questions.  Laurann Montana, PA-C 01/30/2019, 2:33 PM

## 2019-02-26 ENCOUNTER — Other Ambulatory Visit: Payer: Self-pay | Admitting: Interventional Cardiology

## 2019-03-22 ENCOUNTER — Other Ambulatory Visit: Payer: Self-pay | Admitting: Interventional Cardiology

## 2019-03-22 DIAGNOSIS — E785 Hyperlipidemia, unspecified: Secondary | ICD-10-CM

## 2019-05-16 LAB — LIPID PANEL
Chol/HDL Ratio: 5.3 ratio — ABNORMAL HIGH (ref 0.0–5.0)
Cholesterol, Total: 121 mg/dL (ref 100–199)
HDL: 23 mg/dL — ABNORMAL LOW (ref >39)
LDL Chol Calc (NIH): 60 mg/dL (ref 0–99)
Triglycerides: 230 mg/dL — ABNORMAL HIGH (ref 0–149)
VLDL Cholesterol Cal: 38 mg/dL (ref 5–40)

## 2019-05-16 LAB — HEPATIC FUNCTION PANEL
ALT: 19 IU/L (ref 0–44)
AST: 11 IU/L (ref 0–40)
Albumin: 4.5 g/dL (ref 3.8–4.8)
Alkaline Phosphatase: 54 IU/L (ref 39–117)
Bilirubin Total: 0.5 mg/dL (ref 0.0–1.2)
Bilirubin, Direct: 0.16 mg/dL (ref 0.00–0.40)
Total Protein: 6.7 g/dL (ref 6.0–8.5)

## 2019-05-23 ENCOUNTER — Telehealth: Payer: Self-pay

## 2019-05-23 ENCOUNTER — Telehealth: Payer: Self-pay | Admitting: Interventional Cardiology

## 2019-05-23 DIAGNOSIS — E781 Pure hyperglyceridemia: Secondary | ICD-10-CM

## 2019-05-23 MED ORDER — ICOSAPENT ETHYL 1 G PO CAPS: 2.0000 | ORAL_CAPSULE | Freq: Two times a day (BID) | ORAL | 3 refills | Status: DC

## 2019-05-23 NOTE — Telephone Encounter (Signed)
The patient has been notified of the result and verbalized understanding.  All questions (if any) were answered. Frederik Schmidt, RN 05/23/2019 11:40 AM

## 2019-05-23 NOTE — Telephone Encounter (Signed)
We received an approval letter from [employer] and a message through covermymeds as follows: Vania Rea (Key: [id number])  Rx #: [id number]  Vascepa 1GM capsules  Form [employer] Electronic Prior Authorization Form (2017 NCPDP)  Determination  Favorable  Prior authorization for Vascepa has been approved.    Approval per letter is good until 05/22/2020. Case#: [id number]  The pts pharmacy is aware of this approval.

## 2019-05-23 NOTE — Telephone Encounter (Signed)
New message ° ° °Patient is returning call about lab work results. Please call. °

## 2019-05-23 NOTE — Telephone Encounter (Signed)
I started a Vascepa PA through covermymeds. Key: [id number]

## 2019-06-01 ENCOUNTER — Encounter: Payer: Self-pay | Admitting: Interventional Cardiology

## 2019-06-01 NOTE — Telephone Encounter (Signed)
error 

## 2019-07-04 ENCOUNTER — Encounter (INDEPENDENT_AMBULATORY_CARE_PROVIDER_SITE_OTHER): Payer: Self-pay

## 2019-07-04 ENCOUNTER — Other Ambulatory Visit: Payer: Commercial Managed Care - PPO

## 2019-07-04 ENCOUNTER — Other Ambulatory Visit: Payer: Self-pay

## 2019-07-04 DIAGNOSIS — E781 Pure hyperglyceridemia: Secondary | ICD-10-CM

## 2019-07-04 LAB — LIPID PANEL
Chol/HDL Ratio: 5 ratio (ref 0.0–5.0)
Cholesterol, Total: 115 mg/dL (ref 100–199)
HDL: 23 mg/dL — ABNORMAL LOW (ref >39)
LDL Chol Calc (NIH): 58 mg/dL (ref 0–99)
Triglycerides: 205 mg/dL — ABNORMAL HIGH (ref 0–149)
VLDL Cholesterol Cal: 34 mg/dL (ref 5–40)

## 2019-07-16 ENCOUNTER — Other Ambulatory Visit: Payer: Self-pay | Admitting: Nurse Practitioner

## 2019-08-23 IMAGING — MR MRI CERVICAL SPINE WITHOUT CONTRAST
4 of 5 series · 17 of 48 positions shown · non-contrast
Comparison: None.

CLINICAL DATA: Neck pain radiating down the left arm over the last
10 weeks.

EXAM:
MRI CERVICAL SPINE WITHOUT CONTRAST
TECHNIQUE: Multiplanar, multisequence MR imaging of the cervical spine was
performed. No intravenous contrast was administered.

[Series 4: T2 · sagittal · 3.0mm · 0.31mm/px · 7 of 15 slices shown (1 of 2)]
[im 1/15]
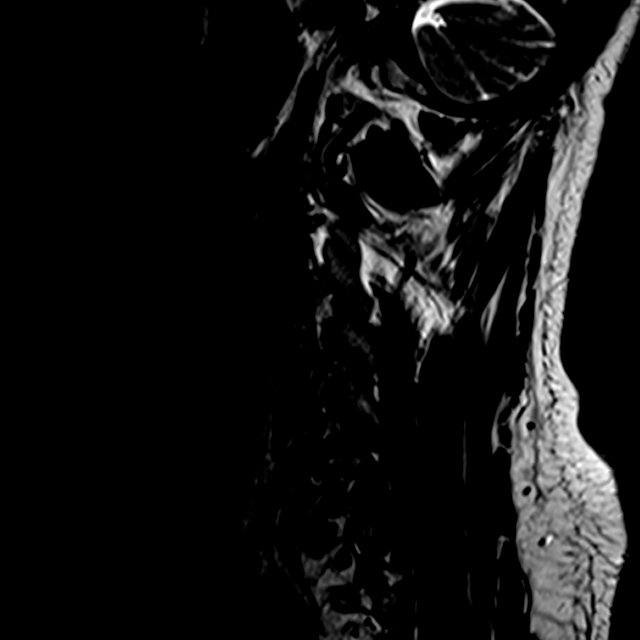
[im 3/15]
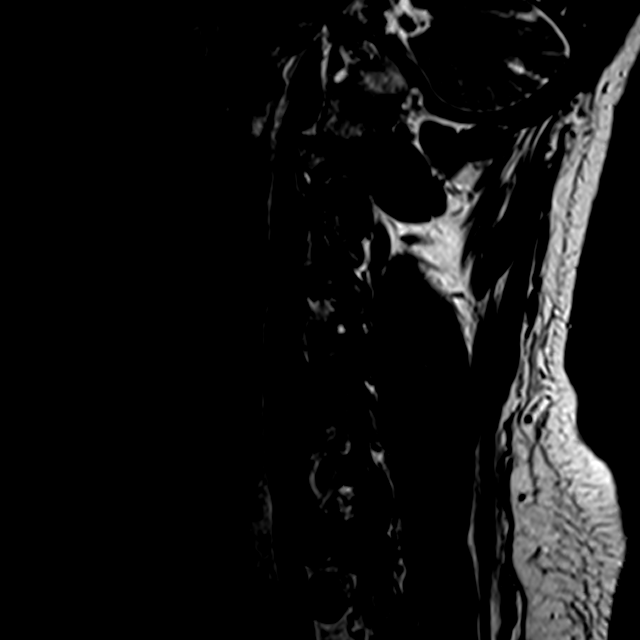
[im 5/15]
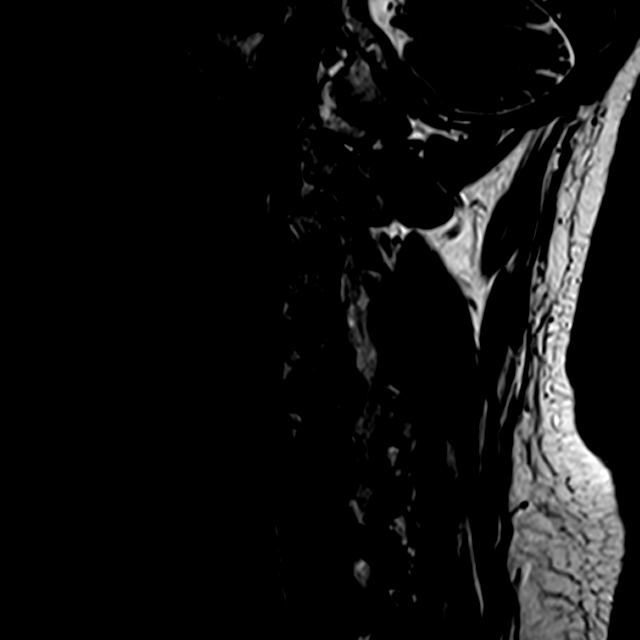
[im 8/15]
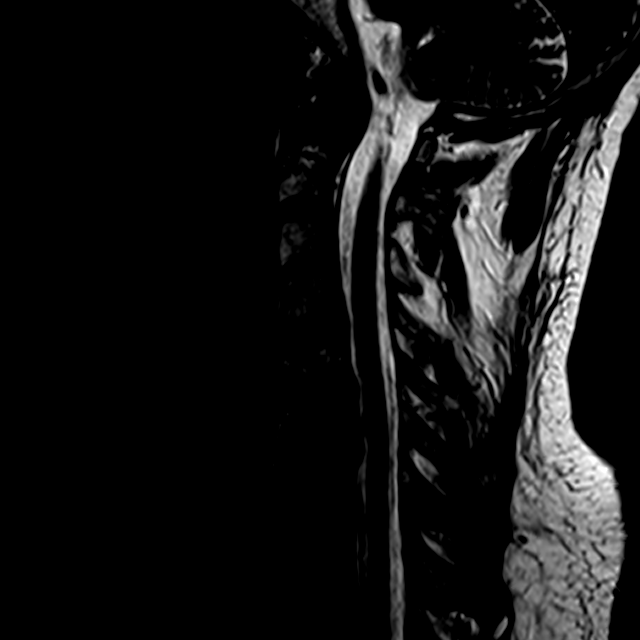
[im 10/15]
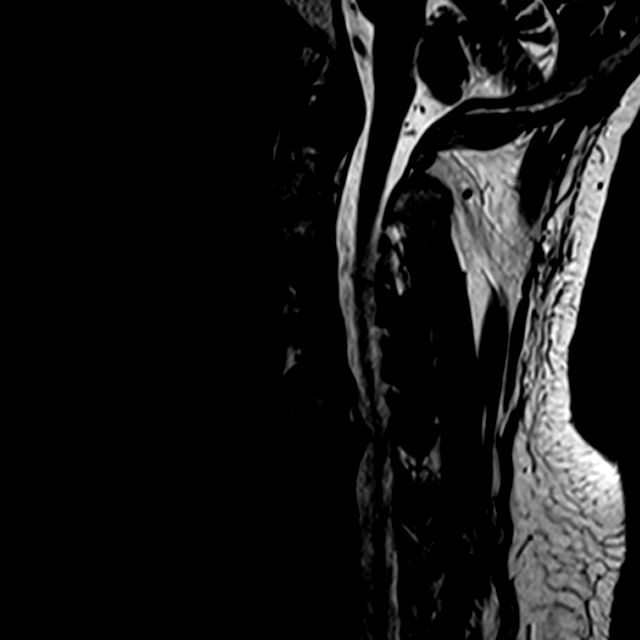
[im 12/15]
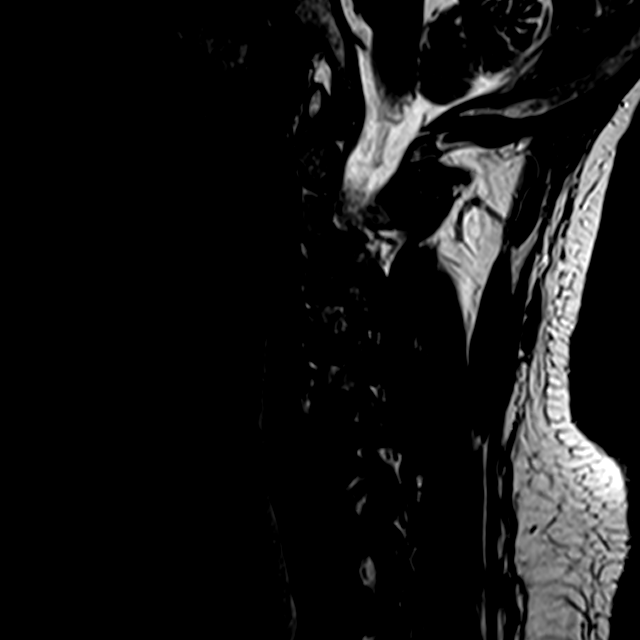
[im 15/15]
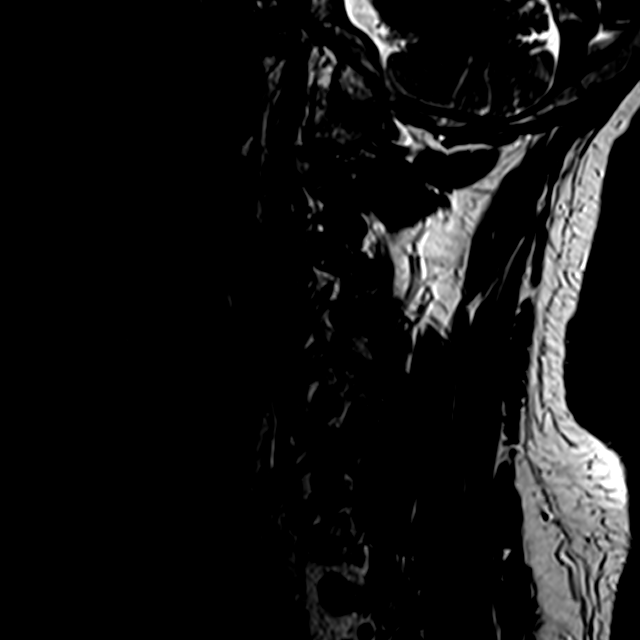

[Series 5: t2_medic · axial · 3.0mm · 0.22mm/px · z∈[-275,-194]mm · 3 of 36 slices shown]
[im 6/36]
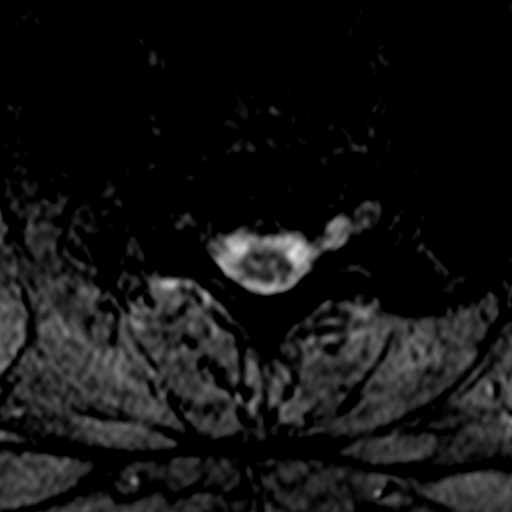
[im 18/36]
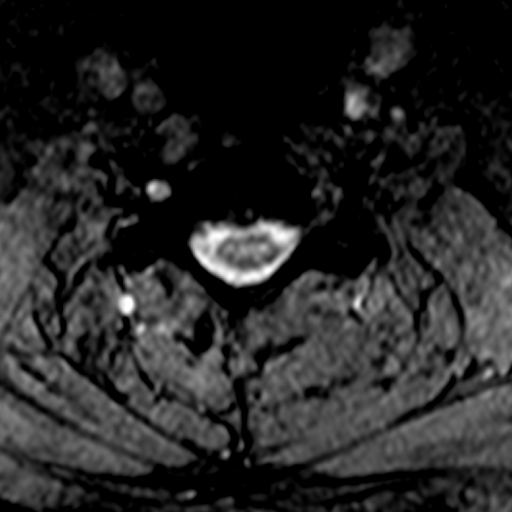
[im 31/36]
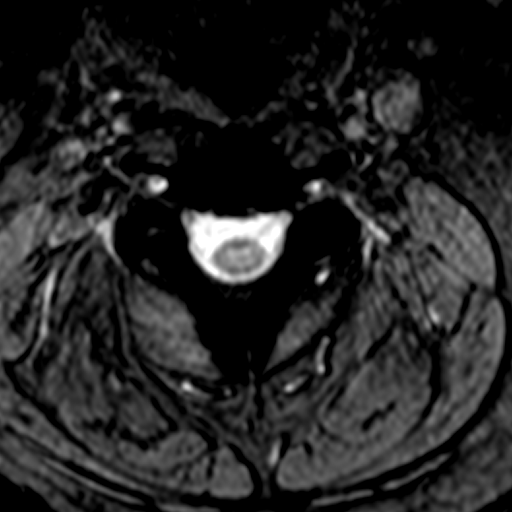

[Series 6: T2 · axial · 3.0mm · 0.22mm/px · z∈[-291,-194]mm · 4 of 36 slices shown (2 of 2)]
[im 1/36]
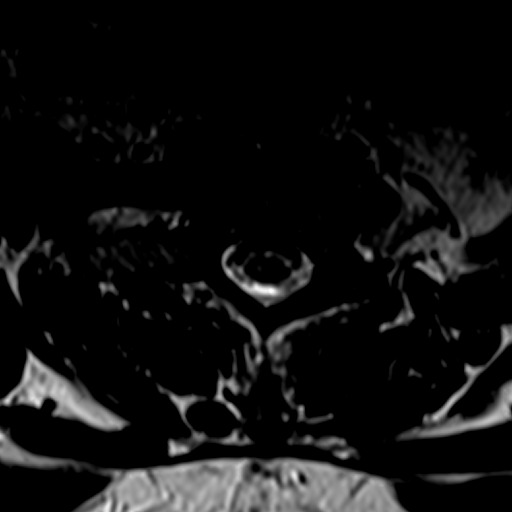
[im 6/36]
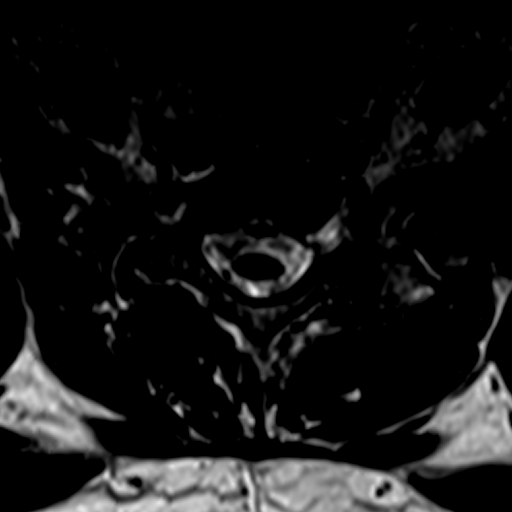
[im 18/36]
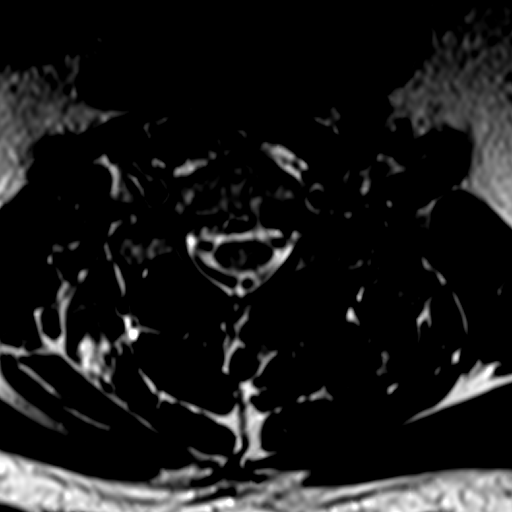
[im 31/36]
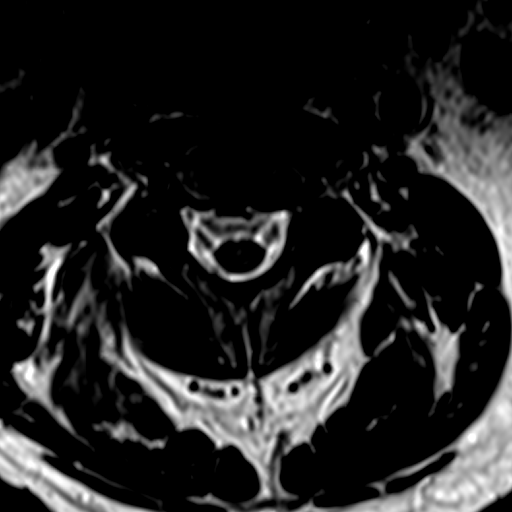

[Series 7: (id) sagittal repeat · sagittal · 3.0mm · 0.82mm/px · 3 of 15 slices shown]
[im 3/15]
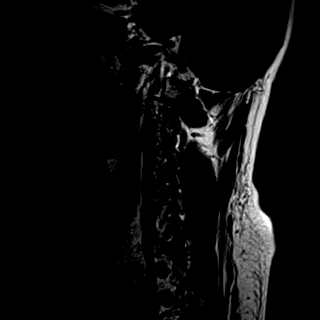
[im 9/15]
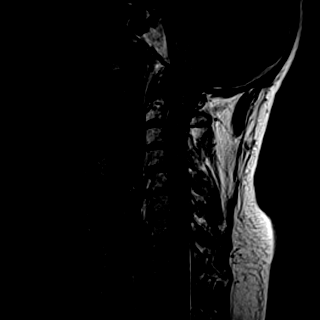
[im 15/15]
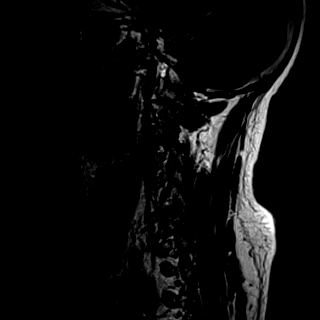

[17 of 48 positions shown; findings below may reference images not displayed]

FINDINGS: Alignment: Straightening of the normal cervical lordosis.

Vertebrae: No fracture or primary bone lesion.

Cord: No cord compression or primary cord lesion.

Posterior Fossa, vertebral arteries, paraspinal tissues: Negative

Disc levels:

Foramen magnum is widely patent. Ordinary osteoarthritis at the C1-2
articulation but no encroachment upon the neural structures.

C2-3: No disc abnormality. Mild facet degeneration. No canal or
foraminal stenosis.

C3-4: Mild bulging of the disc. Bilateral uncovertebral prominence.
Facet osteoarthritis on the left. No canal stenosis. Bilateral
foraminal encroachment by osteophytes, left worse than right. Left
C4 nerve compression would be possible.

C4-5: Endplate osteophytes and bulging of the disc. Mild narrowing
of the ventral subarachnoid space but no compressive narrowing of
the canal or foramina.

C5-6: Spondylosis with endplate osteophytes and shallow protrusion
of the disc more prominent in the right posterolateral direction. No
compressive canal stenosis. Foraminal narrowing on the right that
could affect the right C6 nerve.

C6-7: Broad-based, left posterolateral predominant disc herniation.
Disc material encroaching upon the proximal foramen likely
compresses the left C7 nerve.

C7-T1: Normal interspace.
IMPRESSION: C6-7: Left posterolateral disc herniation with proximal foraminal
extension likely compressing the left C7 nerve.

C5-6: Shallow disc protrusion more prominent towards the right with
proximal foraminal encroachment that could possibly affect the right
C6 nerve.

C3-4: Uncovertebral and left facet osteoarthritis with foraminal
stenosis left worse than right that could in particular affect the
left C4 nerve.

## 2019-09-17 ENCOUNTER — Other Ambulatory Visit: Payer: Self-pay | Admitting: Interventional Cardiology

## 2019-10-12 ENCOUNTER — Other Ambulatory Visit: Payer: Self-pay | Admitting: Nurse Practitioner

## 2019-10-14 ENCOUNTER — Other Ambulatory Visit: Payer: Self-pay | Admitting: Interventional Cardiology

## 2019-10-16 ENCOUNTER — Ambulatory Visit (INDEPENDENT_AMBULATORY_CARE_PROVIDER_SITE_OTHER): Payer: Commercial Managed Care - PPO | Admitting: Interventional Cardiology

## 2019-10-16 ENCOUNTER — Encounter: Payer: Self-pay | Admitting: Interventional Cardiology

## 2019-10-16 ENCOUNTER — Other Ambulatory Visit: Payer: Self-pay

## 2019-10-16 VITALS — BP 118/72 | HR 79 | Ht >= 80 in | Wt 259.4 lb

## 2019-10-16 DIAGNOSIS — I10 Essential (primary) hypertension: Secondary | ICD-10-CM

## 2019-10-16 DIAGNOSIS — I2510 Atherosclerotic heart disease of native coronary artery without angina pectoris: Secondary | ICD-10-CM | POA: Diagnosis not present

## 2019-10-16 DIAGNOSIS — E785 Hyperlipidemia, unspecified: Secondary | ICD-10-CM

## 2019-10-16 DIAGNOSIS — G4733 Obstructive sleep apnea (adult) (pediatric): Secondary | ICD-10-CM | POA: Diagnosis not present

## 2019-10-16 DIAGNOSIS — Z7189 Other specified counseling: Secondary | ICD-10-CM

## 2019-10-16 DIAGNOSIS — Z720 Tobacco use: Secondary | ICD-10-CM

## 2019-10-16 LAB — LIPID PANEL
Chol/HDL Ratio: 4.4 ratio (ref 0.0–5.0)
Cholesterol, Total: 106 mg/dL (ref 100–199)
HDL: 24 mg/dL — ABNORMAL LOW (ref >39)
LDL Chol Calc (NIH): 54 mg/dL (ref 0–99)
Triglycerides: 162 mg/dL — ABNORMAL HIGH (ref 0–149)
VLDL Cholesterol Cal: 28 mg/dL (ref 5–40)

## 2019-10-16 LAB — HEPATIC FUNCTION PANEL
ALT: 27 IU/L (ref 0–44)
AST: 21 IU/L (ref 0–40)
Albumin: 4.5 g/dL (ref 3.8–4.8)
Alkaline Phosphatase: 64 IU/L (ref 39–117)
Bilirubin Total: 0.5 mg/dL (ref 0.0–1.2)
Bilirubin, Direct: 0.18 mg/dL (ref 0.00–0.40)
Total Protein: 6.4 g/dL (ref 6.0–8.5)

## 2019-10-16 NOTE — Progress Notes (Signed)
Cardiology Office Note:    Date:  10/16/2019   ID:  [name] [name], DOB Sep 10, 1955, MRN [id number]  PCP:  Sharilyn Sites, MD  Cardiologist:  Sinclair Grooms, MD   Referring MD: Sharilyn Sites, MD   Chief Complaint  Patient presents with  . Coronary Artery Disease    History of Present Illness:    [name] [name] is a 64 y.o. male with a hx of hx of CAD s/p NSTEMI in 2011 tx with BMS to the RCA, HTN, HL, obesity. Last seen by Dr. Vassie Moselle 3/17. He had a low risk ETT in 3/17 (did not achieve 85% PMHR).Present in October 2018 with ACS and was found to have a de novo 95% stenosis in the distal RCA that was treated with drug-eluting stent.   He is asymptomatic.  He has not been exercising due to COVID-19 pandemic and winter weather.  He denies angina.  No nitroglycerin use.  He has been compliant with medication.  He wants to know if the [name] is doing him any good.  He has not eaten today.  Past Medical History:  Diagnosis Date  . Coronary artery disease    10/18 PCI/DESx1 dRCA, patent mRCA stent, EF 50-55%  . Hypercholesteremia   . Hypertension   . Myocardial infarction [facility])    10/14/2009    Past Surgical History:  Procedure Laterality Date  . CARDIAC CATHETERIZATION     10/14/2012-with stent  . CATARACT EXTRACTION, BILATERAL     [facility] [place]  . CORONARY ANGIOPLASTY  10/15/2011   1 stent  . CORONARY STENT INTERVENTION N/A 06/28/2017   Procedure: CORONARY STENT INTERVENTION;  Surgeon: Belva Crome, MD;  Location: [place] [facility];  Service: Cardiovascular;  Laterality: N/A;  . LEFT HEART CATH AND CORONARY ANGIOGRAPHY N/A 06/28/2017   Procedure: LEFT HEART CATH AND CORONARY ANGIOGRAPHY;  Surgeon: Belva Crome, MD;  Location: [place] [facility];  Service: Cardiovascular;  Laterality: N/A;    Current Medications: Current Meds  Medication Sig  . Ascorbic Acid (VITAMIN C) 500 MG CAPS Take 4 capsules by mouth 2 (two) times daily.   Marland Kitchen atorvastatin  (LIPITOR) 80 MG tablet TAKE 1 TABLET BY MOUTH EVERY DAY AT 6 PM  . betamethasone dipropionate 0.05 % cream APPLY TO THE AFFECTEDNAREA(S) TWICE DAILY AS NEEDED; NOT TO FACE, GROIN, UNDER ARMS.  . Cholecalciferol (VITAMIN D3) 25 MCG (1000 UT) CAPS Take 1 capsule by mouth daily.  . clopidogrel (PLAVIX) 75 MG tablet TAKE 1 TABLET BY MOUTH EVERY DAY  . fluticasone (CUTIVATE) 0.05 % cream Apply 1 application topically daily as needed. (rash)  . fluticasone (FLONASE) 50 MCG/ACT nasal spray Place 2 sprays into both nostrils daily as needed for allergies.   Marland Kitchen ketoconazole (NIZORAL) 2 % cream Apply 1 application topically daily as needed. (skin rash)  . metoprolol tartrate (LOPRESSOR) 25 MG tablet TAKE 1 TABLET BY MOUTH TWICE A DAY  . nitroGLYCERIN (NITROSTAT) 0.4 MG SL tablet Place 1 tablet (0.4 mg total) under the tongue every 5 (five) minutes x 3 doses as needed for chest pain.  Marland Kitchen VASCEPA 1 g capsule TAKE 2 CAPSULES (2 G TOTAL) BY MOUTH 2 (TWO) TIMES DAILY.     Allergies:   Patient has no known allergies.   Social History   Socioeconomic History  . Marital status: Married    Spouse name: Not on file  . Number of children: Not on file  . Years of education: Not on file  .  Highest education level: Not on file  Occupational History  . Not on file  Tobacco Use  . Smoking status: Light Tobacco Smoker    Packs/day: 0.50    Years: 30.00    Pack years: 15.00    Types: Cigarettes  . Smokeless tobacco: Never Used  Substance and Sexual Activity  . Alcohol use: Yes    Alcohol/week: 7.0 standard drinks    Types: 5 Cans of beer, 2 Shots of liquor per week  . Drug use: No  . Sexual activity: Yes    Birth control/protection: None  Other Topics Concern  . Not on file  Social History Narrative  . Not on file   Social Determinants of Health   Financial Resource Strain:   . Difficulty of Paying Living Expenses: Not on file  Food Insecurity:   . Worried About Programme researcher, broadcasting/film/video in the Last Year:  Not on file  . Ran Out of Food in the Last Year: Not on file  Transportation Needs:   . Lack of Transportation (Medical): Not on file  . Lack of Transportation (Non-Medical): Not on file  Physical Activity:   . Days of Exercise per Week: Not on file  . Minutes of Exercise per Session: Not on file  Stress:   . Feeling of Stress : Not on file  Social Connections:   . Frequency of Communication with Friends and Family: Not on file  . Frequency of Social Gatherings with Friends and Family: Not on file  . Attends Religious Services: Not on file  . Active Member of Clubs or Organizations: Not on file  . Attends Banker Meetings: Not on file  . Marital Status: Not on file     Family History: The patient's family history includes CAD in his mother; Cancer in his father; Coronary artery disease (age of onset: 85) in his brother; Stroke (age of onset: 45) in his brother. There is no history of Anesthesia problems, Hypotension, Malignant hyperthermia, or Pseudochol deficiency.  ROS:   Please see the history of present illness.    No complaints.  Family members have gotten COVID-19.  He has been socially distancing and so far has had no trouble.  All other systems reviewed and are negative.  EKGs/Labs/Other Studies Reviewed:    The following studies were reviewed today: No new data  EKG:  EKG reveals normal sinus rhythm without changes from prior.  Recent Labs: 05/15/2019: ALT 19  Recent Lipid Panel    Component Value Date/Time   CHOL 115 07/04/2019 0752   TRIG 205 (H) 07/04/2019 0752   HDL 23 (L) 07/04/2019 0752   CHOLHDL 5.0 07/04/2019 0752   CHOLHDL 7.6 06/29/2017 0504   VLDL 50 (H) 06/29/2017 0504   LDLCALC 58 07/04/2019 0752    Physical Exam:    VS:  BP 118/72   Pulse 79   Ht 6\' 9"  (2.057 m)   Wt 259 lb 6.4 oz (117.7 kg)   SpO2 96%   BMI 27.80 kg/m     Wt Readings from Last 3 Encounters:  10/16/19 259 lb 6.4 oz (117.7 kg)  10/03/18 266 lb 6.4 oz (120.8  kg)  02/04/18 264 lb (119.7 kg)     GEN: Obesity. No acute distress HEENT: Normal NECK: No JVD. LYMPHATICS: No lymphadenopathy CARDIAC: RRR without murmur, gallop, or edema. VASCULAR:  Normal Pulses. No bruits. RESPIRATORY:  Clear to auscultation without rales, wheezing or rhonchi  ABDOMEN: Soft, non-tender, non-distended, No pulsatile mass, MUSCULOSKELETAL: No  deformity  SKIN: Warm and dry NEUROLOGIC:  Alert and oriented x 3 PSYCHIATRIC:  Normal affect   ASSESSMENT:    1. Coronary artery disease involving native coronary artery of native heart without angina pectoris   2. Essential hypertension   3. Tobacco abuse   4. Obstructive sleep apnea   5. Dyslipidemia   6. Educated about COVID-19 virus infection    PLAN:    In order of problems listed above:  1. Stable coronary disease without symptomatic angina.  Secondary prevention discussed. 2. Blood pressure is at target. 3. No longer smoking. 4. Compliant with CPAP. 5. Lipid panel will be obtained today.  Last LDL was great.  Triglyceride was 205.  We will repeat a lipid panel today. 6. Covid vaccine is advocated by the patient as soon as he can get it.  3W's is being applied  Overall education and awareness concerning primary/secondary risk prevention was discussed in detail: LDL less than 70, hemoglobin A1c less than 7, blood pressure target less than 130/80 mmHg, >150 minutes of moderate aerobic activity per week, avoidance of smoking, weight control (via diet and exercise), and continued surveillance/management of/for obstructive sleep apnea.    Medication Adjustments/Labs and Tests Ordered: Current medicines are reviewed at length with the patient today.  Concerns regarding medicines are outlined above.  Orders Placed This Encounter  Procedures  . Hepatic function panel  . Lipid panel  . EKG 12-Lead   No orders of the defined types were placed in this encounter.   Patient Instructions  Medication Instructions:    Your physician recommends that you continue on your current medications as directed. Please refer to the Current Medication list given to you today.  *If you need a refill on your cardiac medications before your next appointment, please call your pharmacy*  Lab Work: Liver and Lipid today  If you have labs (blood work) drawn today and your tests are completely normal, you will receive your results only by: Marland Kitchen MyChart Message (if you have MyChart) OR . A paper copy in the mail If you have any lab test that is abnormal or we need to change your treatment, we will call you to review the results.  Testing/Procedures: None  Follow-Up: At [facility], you and your health needs are our priority.  As part of our continuing mission to provide you with exceptional heart care, we have created designated Provider Care Teams.  These Care Teams include your primary Cardiologist (physician) and Advanced Practice Providers (APPs -  Physician Assistants and Nurse Practitioners) who all work together to provide you with the care you need, when you need it.  Your next appointment:   12 month(s)  The format for your next appointment:   In Person  Provider:   You may see Lesleigh Noe, MD or one of the following Advanced Practice Providers on your designated Care Team:    Norma Fredrickson, NP  Nada Boozer, NP  Georgie Chard, NP   Other Instructions      Signed, Lesleigh Noe, MD  10/16/2019 10:29 AM    [facility]

## 2019-10-16 NOTE — Patient Instructions (Signed)
Medication Instructions:  Your physician recommends that you continue on your current medications as directed. Please refer to the Current Medication list given to you today.  *If you need a refill on your cardiac medications before your next appointment, please call your pharmacy*  Lab Work: Liver and Lipid today  If you have labs (blood work) drawn today and your tests are completely normal, you will receive your results only by: Marland Kitchen MyChart Message (if you have MyChart) OR . A paper copy in the mail If you have any lab test that is abnormal or we need to change your treatment, we will call you to review the results.  Testing/Procedures: None  Follow-Up: At [facility], you and your health needs are our priority.  As part of our continuing mission to provide you with exceptional heart care, we have created designated Provider Care Teams.  These Care Teams include your primary Cardiologist (physician) and Advanced Practice Providers (APPs -  Physician Assistants and Nurse Practitioners) who all work together to provide you with the care you need, when you need it.  Your next appointment:   12 month(s)  The format for your next appointment:   In Person  Provider:   You may see Lesleigh Noe, MD or one of the following Advanced Practice Providers on your designated Care Team:    Norma Fredrickson, NP  Nada Boozer, NP  Georgie Chard, NP   Other Instructions

## 2019-10-24 ENCOUNTER — Other Ambulatory Visit: Payer: Self-pay | Admitting: Nurse Practitioner

## 2019-10-24 ENCOUNTER — Other Ambulatory Visit: Payer: Self-pay | Admitting: Interventional Cardiology

## 2019-10-26 ENCOUNTER — Other Ambulatory Visit: Payer: Self-pay | Admitting: Nurse Practitioner

## 2019-11-03 ENCOUNTER — Ambulatory Visit: Payer: Commercial Managed Care - PPO | Attending: Internal Medicine

## 2019-11-03 ENCOUNTER — Other Ambulatory Visit: Payer: Self-pay

## 2019-11-03 DIAGNOSIS — Z23 Encounter for immunization: Secondary | ICD-10-CM

## 2019-11-03 NOTE — Progress Notes (Signed)
   Covid-19 Vaccination Clinic  Name:  [name] [name]    MRN: [id number] DOB: [birth year]  11/03/2019  [name] [name] was observed post Covid-19 immunization for 15 minutes without incident. He was provided with Vaccine Information Sheet and instruction to access the V-Safe system.   [name] [name] was instructed to call 911 with any severe reactions post vaccine: Marland Kitchen Difficulty breathing  . Swelling of face and throat  . A fast heartbeat  . A bad rash all over body  . Dizziness and weakness   Immunizations Administered    Name Date Dose VIS Date Route   Moderna COVID-19 Vaccine 11/03/2019  8:43 AM 0.5 mL 08/01/2019 Intramuscular   Manufacturer: Moderna   Lot: [id number]   NDC: 91504-136-43

## 2019-12-05 ENCOUNTER — Ambulatory Visit: Payer: Commercial Managed Care - PPO | Attending: Internal Medicine

## 2019-12-05 DIAGNOSIS — Z23 Encounter for immunization: Secondary | ICD-10-CM

## 2019-12-05 NOTE — Progress Notes (Signed)
   Covid-19 Vaccination Clinic  Name:  BROOX LONIGRO    MRN: [id number] DOB: [birth year]  12/05/2019  Mr. Glendening was observed post Covid-19 immunization for 15 minutes without incident. He was provided with Vaccine Information Sheet and instruction to access the V-Safe system.   Mr. Isakson was instructed to call 911 with any severe reactions post vaccine: Marland Kitchen Difficulty breathing  . Swelling of face and throat  . A fast heartbeat  . A bad rash all over body  . Dizziness and weakness   Immunizations Administered    Name Date Dose VIS Date Route   Moderna COVID-19 Vaccine 12/05/2019  9:31 AM 0.5 mL 08/01/2019 Intramuscular   Manufacturer: Moderna   Lot: [id number]   NDC: 09628-366-29

## 2020-04-23 ENCOUNTER — Telehealth: Payer: Self-pay

## 2020-04-23 NOTE — Telephone Encounter (Signed)
**Note De-Identified Zakiya Sporrer Obfuscation** I started a Vascepa PA through covermymeds: Key: [employer]

## 2020-10-07 NOTE — Progress Notes (Signed)
CARDIOLOGY OFFICE NOTE  Date:  10/21/2020    [name] [name] Date of Birth: 06-Dec-1955 Medical Record #[id number]  PCP:  Sharilyn Sites, MD  Cardiologist:  Tamala Julian  Chief Complaint  Patient presents with  . Follow-up    Seen for Dr. Tamala Julian    History of Present Illness: [name] [name] is a 65 y.o. male who presents today for a follow up visit/one year check. Seen for Dr.Smith.   He has a history of known CAD s/p NSTEMI in 2011 tx with BMS to the RCA, HTN, HLD, and obesity. He had a low risk ETT in 3/17 (did not achieve 85% PMHR).    Presented in October 2018 with ACS and was found to have a de novo 95% stenosis in the distal RCA that was treated with drug-eluting stent.    Last seen by Dr. Tamala Julian a year ago - not exercising due to the pandemic. Otherwise felt to be doing ok. Has been changed to just Plavix as monotherapy.   Comes in today. Here alone. He feels good. No chest pain. Not short of breath. Was on vacation last week and "vacationed" some. He has really cut his portions - doing more Slim Fast. He has not had labs in quite some time. He is fasting today. He is back working - almost full time due to the pandemic and the employment issues that have occurred. He is playing a little more golf. Still smoking - does not quantify how much but trying to do less. Has cut back his alcohol.   Past Medical History:  Diagnosis Date  . Coronary artery disease    10/18 PCI/DESx1 dRCA, patent mRCA stent, EF 50-55%  . Hypercholesteremia   . Hypertension   . Myocardial infarction [facility])    10/14/2009    Past Surgical History:  Procedure Laterality Date  . CARDIAC CATHETERIZATION     10/14/2012-with stent  . CATARACT EXTRACTION, BILATERAL     [facility] [place]  . CORONARY ANGIOPLASTY  10/15/2011   1 stent  . CORONARY STENT INTERVENTION N/A 06/28/2017   Procedure: CORONARY STENT INTERVENTION;  Surgeon: Belva Crome, MD;  Location: [place] [facility];  Service: Cardiovascular;   Laterality: N/A;  . LEFT HEART CATH AND CORONARY ANGIOGRAPHY N/A 06/28/2017   Procedure: LEFT HEART CATH AND CORONARY ANGIOGRAPHY;  Surgeon: Belva Crome, MD;  Location: [place] [facility];  Service: Cardiovascular;  Laterality: N/A;     Medications: Current Meds  Medication Sig  . Ascorbic Acid (VITAMIN C) 500 MG CAPS Take 4 capsules by mouth 2 (two) times daily.   Marland Kitchen atorvastatin (LIPITOR) 80 MG tablet TAKE 1 TABLET BY MOUTH EVERY DAY AT 6 PM  . betamethasone dipropionate 0.05 % cream APPLY TO THE AFFECTEDNAREA(S) TWICE DAILY AS NEEDED; NOT TO FACE, GROIN, UNDER ARMS.  . Cholecalciferol (VITAMIN D3) 25 MCG (1000 UT) CAPS Take 1 capsule by mouth daily.  . clopidogrel (PLAVIX) 75 MG tablet TAKE 1 TABLET BY MOUTH EVERY DAY  . fluticasone (CUTIVATE) 0.05 % cream Apply 1 application topically daily as needed. (rash)  . fluticasone (FLONASE) 50 MCG/ACT nasal spray Place 2 sprays into both nostrils daily as needed for allergies.   Marland Kitchen ketoconazole (NIZORAL) 2 % cream Apply 1 application topically daily as needed. (skin rash)  . metoprolol tartrate (LOPRESSOR) 25 MG tablet TAKE 1 TABLET BY MOUTH TWICE A DAY  . VASCEPA 1 g capsule TAKE 2 CAPSULES (2 G TOTAL) BY MOUTH 2 (TWO)  TIMES DAILY.  . [DISCONTINUED] nitroGLYCERIN (NITROSTAT) 0.4 MG SL tablet Place 1 tablet (0.4 mg total) under the tongue every 5 (five) minutes x 3 doses as needed for chest pain.     Allergies: No Known Allergies  Social History: The patient  reports that he has been smoking cigarettes. He has a 15.00 pack-year smoking history. He has never used smokeless tobacco. He reports current alcohol use of about 7.0 standard drinks of alcohol per week. He reports that he does not use drugs.   Family History: The patient's family history includes CAD in his mother; Cancer in his father; Coronary artery disease (age of onset: 94) in his brother; Stroke (age of onset: 72) in his brother.   Review of Systems: Please see the history  of present illness.   All other systems are reviewed and negative.   Physical Exam: VS:  BP 110/60   Pulse 69   Ht '5\' 8"'  (1.727 m)   Wt 237 lb 3.2 oz (107.6 kg)   SpO2 95%   BMI 36.07 kg/m  .  BMI Body mass index is 36.07 kg/m.  Wt Readings from Last 3 Encounters:  10/21/20 237 lb 3.2 oz (107.6 kg)  10/16/19 259 lb 6.4 oz (117.7 kg)  10/03/18 266 lb 6.4 oz (120.8 kg)    General: Pleasant. Well developed, well nourished and in no acute distress. His weight is down from last year.  His weight is down almost 30 pounds in the past 2 years.  Neck: Supple, no JVD, carotid bruits, or masses noted. Face is ruddy.  Cardiac: Regular rate and rhythm. No murmurs, rubs, or gallops. No edema.  Respiratory:  Lungs are fairly clear to auscultation bilaterally with normal work of breathing.  GI: Soft and nontender.  MS: No deformity or atrophy. Gait and ROM intact.  Skin: Warm and dry. Color is normal.  Neuro:  Strength and sensation are intact and no gross focal deficits noted.  Psych: Alert, appropriate and with normal affect.   LABORATORY DATA:  EKG:  EKG is ordered today.  Personally reviewed by me. This demonstrates NSR.  Lab Results  Component Value Date   WBC 10.7 07/13/2017   HGB 16.2 07/13/2017   HCT 45.8 07/13/2017   PLT 252 07/13/2017   GLUCOSE 112 (H) 10/03/2018   CHOL 106 10/16/2019   TRIG 162 (H) 10/16/2019   HDL 24 (L) 10/16/2019   LDLCALC 54 10/16/2019   ALT 27 10/16/2019   AST 21 10/16/2019   NA 139 10/03/2018   K 4.5 10/03/2018   CL 102 10/03/2018   CREATININE 1.10 10/03/2018   BUN 13 10/03/2018   CO2 21 10/03/2018   TSH 2.451 06/28/2017   INR 1.02 06/28/2017   HGBA1C 5.6 06/28/2017     BNP (last 3 results) No results for input(s): BNP in the last 8760 hours.  ProBNP (last 3 results) No results for input(s): PROBNP in the last 8760 hours.   Other Studies Reviewed Today:  CORONARY STENT INTERVENTION 05/2017  LEFT HEART CATH AND CORONARY ANGIOGRAPHY     Conclusion   Eccentric 85% thrombus containing distal RCA lesion beyond the previously placed mid RCA stent.  The previously placed stent contains diffuse 30-40% narrowing.  Widely patent left main.  Irregularities throughout the proximal to mid LAD with up to 60% in the mid vessel.  The first diagonal contains 40% ostial proximal narrowing.  40% proximal circumflex narrowing.  Reduction in 85% distal RCA stenosis to 0% with TIMI grade  III flow following 3.5 x 18 Onyx DES deployed at 14 atm.  Post PCI there is eccentric 50% ostial RCA narrowing either related to coronary spasm or trauma from guide catheter.  No evidence of reduced flow or dissection.  Low normal LV function with EF 50-55%.  LVEDP 10 mmHg.  RECOMMENDATIONS:   Aspirin and Brilinta for 12 months.  Aggressive guideline based risk factor modification: A1c less than 7, LDL less than 70, blood pressure 130/85 mmHg or less, smoking cessation all discussed with patient.  Eligible for discharge tomorrow if no complications arise.      ASSESSMENT & PLAN:     1. CAD - remote PCI - doing well - no worrisome symptoms - needs aggressive risk factor modification. Labs today. NTG refilled.   2. HTN - BP is fine. No changes made today.   3. Ongoing tobacco abuse - total cessation encouraged - he is not ready.   4. OSA - on CPAP  5. HLD - lab today.   Current medicines are reviewed with the patient today.  The patient does not have concerns regarding medicines other than what has been noted above.  The following changes have been made:  See above.  Labs/ tests ordered today include:    Orders Placed This Encounter  Procedures  . Basic metabolic panel  . CBC  . Hepatic function panel  . Lipid panel  . EKG 12-Lead     Disposition:   FU with Dr. Tamala Julian in one year. Labs today. NTG refilled today.   Patient is agreeable to this plan and will call if any problems develop in the interim.   SignedTruitt Merle, NP  10/21/2020 12:09 PM  [facility] [address] [place], [place]  [address] Phone: [phone] Fax: [phone]

## 2020-10-21 ENCOUNTER — Ambulatory Visit (INDEPENDENT_AMBULATORY_CARE_PROVIDER_SITE_OTHER): Payer: Commercial Managed Care - PPO | Admitting: Nurse Practitioner

## 2020-10-21 ENCOUNTER — Other Ambulatory Visit: Payer: Self-pay

## 2020-10-21 ENCOUNTER — Encounter: Payer: Self-pay | Admitting: Nurse Practitioner

## 2020-10-21 VITALS — BP 110/60 | HR 69 | Ht 68.0 in | Wt 237.2 lb

## 2020-10-21 DIAGNOSIS — Z720 Tobacco use: Secondary | ICD-10-CM | POA: Diagnosis not present

## 2020-10-21 DIAGNOSIS — I10 Essential (primary) hypertension: Secondary | ICD-10-CM

## 2020-10-21 DIAGNOSIS — G4733 Obstructive sleep apnea (adult) (pediatric): Secondary | ICD-10-CM

## 2020-10-21 DIAGNOSIS — I2510 Atherosclerotic heart disease of native coronary artery without angina pectoris: Secondary | ICD-10-CM | POA: Diagnosis not present

## 2020-10-21 DIAGNOSIS — E785 Hyperlipidemia, unspecified: Secondary | ICD-10-CM

## 2020-10-21 LAB — HEPATIC FUNCTION PANEL
ALT: 22 IU/L (ref 0–44)
AST: 15 IU/L (ref 0–40)
Albumin: 4.6 g/dL (ref 3.8–4.8)
Alkaline Phosphatase: 63 IU/L (ref 44–121)
Bilirubin Total: 0.5 mg/dL (ref 0.0–1.2)
Bilirubin, Direct: 0.17 mg/dL (ref 0.00–0.40)
Total Protein: 6.7 g/dL (ref 6.0–8.5)

## 2020-10-21 LAB — BASIC METABOLIC PANEL
BUN/Creatinine Ratio: 14 (ref 10–24)
BUN: 14 mg/dL (ref 8–27)
CO2: 23 mmol/L (ref 20–29)
Calcium: 9.4 mg/dL (ref 8.6–10.2)
Chloride: 105 mmol/L (ref 96–106)
Creatinine, Ser: 0.97 mg/dL (ref 0.76–1.27)
GFR calc Af Amer: 95 mL/min/{1.73_m2} (ref >59)
GFR calc non Af Amer: 82 mL/min/{1.73_m2} (ref >59)
Glucose: 93 mg/dL (ref 65–99)
Potassium: 4.2 mmol/L (ref 3.5–5.2)
Sodium: 142 mmol/L (ref 134–144)

## 2020-10-21 LAB — LIPID PANEL
Chol/HDL Ratio: 3.4 ratio (ref 0.0–5.0)
Cholesterol, Total: 101 mg/dL (ref 100–199)
HDL: 30 mg/dL — ABNORMAL LOW (ref >39)
LDL Chol Calc (NIH): 51 mg/dL (ref 0–99)
Triglycerides: 108 mg/dL (ref 0–149)
VLDL Cholesterol Cal: 20 mg/dL (ref 5–40)

## 2020-10-21 LAB — CBC
Hematocrit: 47.8 % (ref 37.5–51.0)
Hemoglobin: 16.9 g/dL (ref 13.0–17.7)
MCH: 31.1 pg (ref 26.6–33.0)
MCHC: 35.4 g/dL (ref 31.5–35.7)
MCV: 88 fL (ref 79–97)
Platelets: 189 10*3/uL (ref 150–450)
RBC: 5.44 x10E6/uL (ref 4.14–5.80)
RDW: 12.3 % (ref 11.6–15.4)
WBC: 8.4 10*3/uL (ref 3.4–10.8)

## 2020-10-21 MED ORDER — NITROGLYCERIN 0.4 MG SL SUBL: 0.4000 mg | SUBLINGUAL_TABLET | SUBLINGUAL | 2 refills | Status: DC | PRN

## 2020-10-21 NOTE — Patient Instructions (Addendum)
After Visit Summary:  We will be checking the following labs today - BMET, CBC, HPF and Lipids   Medication Instructions:    Continue with your current medicines.   I refilled the NTG today   If you need a refill on your cardiac medications before your next appointment, please call your pharmacy.     Testing/Procedures To Be Arranged:  N/A  Follow-Up:   See Dr. Katrinka Blazing in one year. You will receive a reminder letter in the mail two months in advance. If you don't receive a letter, please call our office to schedule the follow-up appointment.     At [facility], you and your health needs are our priority.  As part of our continuing mission to provide you with exceptional heart care, we have created designated Provider Care Teams.  These Care Teams include your primary Cardiologist (physician) and Advanced Practice Providers (APPs -  Physician Assistants and Nurse Practitioners) who all work together to provide you with the care you need, when you need it.  Special Instructions:  . Stay safe, wash your hands for at least 20 seconds and wear a mask when needed.  . It was good to talk with you today.  Otto Herb for losing weight but work on the smoking!!   Call the [facility] office at [phone] if you have any questions, problems or concerns.

## 2020-10-22 NOTE — Progress Notes (Signed)
Pt has been made aware of normal result and verbalized understanding.  jw

## 2020-11-06 ENCOUNTER — Other Ambulatory Visit: Payer: Self-pay | Admitting: Interventional Cardiology

## 2020-11-14 ENCOUNTER — Other Ambulatory Visit: Payer: Self-pay | Admitting: Interventional Cardiology

## 2020-12-25 ENCOUNTER — Other Ambulatory Visit: Payer: Self-pay | Admitting: Interventional Cardiology

## 2020-12-25 MED ORDER — METOPROLOL TARTRATE 25 MG PO TABS: 25.0000 mg | ORAL_TABLET | Freq: Two times a day (BID) | ORAL | 3 refills | Status: DC

## 2021-03-28 ENCOUNTER — Telehealth: Payer: Self-pay

## 2021-03-28 NOTE — Telephone Encounter (Signed)
**Note De-Identified Sherisa Gilvin Obfuscation** I started a Vascepa PA through covermymeds X 2 and received the following message each time: Cannot find matching patient with Name and Date of Birth provided. For additional information, please contact the phone number on the back of the member prescription ID card.  I called the pharmacy number located on the back of his [facility] UMR ins card to do this Vascepa PA with sone at [facility]. I s/w Tiffany who advised me that she cannot find the pt in their system and advised that the pt may have an updated card that he has not presented to [place].  Per Tiffany's recommendation I called the pt to ask if he has a new ins card and to request the information that I need to do the PAbut got no answer so I left a message on his VM asking him to call Larita Fife at Dr Michaelle Copas office at [facility] at [phone].

## 2021-04-02 NOTE — Telephone Encounter (Signed)
Called pt and left message letting him know that [name] [name] is out of the office for the remainder of the day but I will  have her reach out when she's back.  Advised to make sure he has his insurance card handy because she needs some information off of the card.

## 2021-04-02 NOTE — Telephone Encounter (Signed)
Pt is returning a call  

## 2021-04-03 NOTE — Telephone Encounter (Signed)
**Note De-Identified Luara Faye Obfuscation** The pt called back with his UMR/UHC ins information that I need to do a Vascepa PA. Member ID: [id number] BIN: [id number] PCN:[id number]: [id number]  The pt states that he received this card on 02/13/2021.

## 2021-04-03 NOTE — Telephone Encounter (Signed)
**Note De-Identified Keaisha Sublette Obfuscation** No answer so I left a message on the pts VM advising him that I need information from his newest ins card so I can do his Vascepa PA. I need his: Member ID BIN # PCN # RXGRP #

## 2021-04-03 NOTE — Telephone Encounter (Signed)
**Note De-Identified Brianne Maina Obfuscation** Name brand Vascepa PA done through CCM and received the following message:  Randle Shatzer (Key: [id number]) Vascepa 1GM capsules Form: [employer] Electronic PA Form (2017 NCPDP) Determination: Favorable Prior authorization for Vascepa has been approved.  Message from plan: Case [id number] Coverage Start Date:03/04/2021;Coverage End Date:04/03/2022  I have notified [facility] of this approval.

## 2021-05-06 ENCOUNTER — Other Ambulatory Visit: Payer: Self-pay

## 2021-05-06 ENCOUNTER — Other Ambulatory Visit: Payer: Self-pay | Admitting: Interventional Cardiology

## 2021-05-06 MED ORDER — VASCEPA 1 G PO CAPS: 2.0000 g | ORAL_CAPSULE | Freq: Two times a day (BID) | ORAL | 1 refills | Status: DC

## 2021-06-27 ENCOUNTER — Other Ambulatory Visit: Payer: Self-pay | Admitting: *Deleted

## 2021-06-27 MED ORDER — ATORVASTATIN CALCIUM 80 MG PO TABS: ORAL_TABLET | ORAL | 3 refills | Status: DC

## 2021-06-27 MED ORDER — METOPROLOL TARTRATE 25 MG PO TABS: 25.0000 mg | ORAL_TABLET | Freq: Two times a day (BID) | ORAL | 3 refills | Status: DC

## 2021-06-27 MED ORDER — CLOPIDOGREL BISULFATE 75 MG PO TABS: 75.0000 mg | ORAL_TABLET | Freq: Every day | ORAL | 3 refills | Status: DC

## 2021-10-30 DIAGNOSIS — X32XXXD Exposure to sunlight, subsequent encounter: Secondary | ICD-10-CM | POA: Diagnosis not present

## 2021-10-30 DIAGNOSIS — L400 Psoriasis vulgaris: Secondary | ICD-10-CM | POA: Diagnosis not present

## 2021-10-30 DIAGNOSIS — L570 Actinic keratosis: Secondary | ICD-10-CM | POA: Diagnosis not present

## 2021-11-20 ENCOUNTER — Other Ambulatory Visit: Payer: Self-pay | Admitting: Interventional Cardiology

## 2021-11-20 ENCOUNTER — Encounter: Payer: Self-pay | Admitting: Interventional Cardiology

## 2021-11-20 MED ORDER — ICOSAPENT ETHYL 1 G PO CAPS: 2.0000 g | ORAL_CAPSULE | Freq: Two times a day (BID) | ORAL | 0 refills | Status: DC

## 2021-11-20 NOTE — Telephone Encounter (Signed)
error 

## 2021-12-18 ENCOUNTER — Other Ambulatory Visit: Payer: Self-pay | Admitting: Interventional Cardiology

## 2022-02-24 NOTE — Progress Notes (Signed)
Cardiology Office Note:    Date:  02/27/2022   ID:  [name] [name], DOB June 11, 1956, MRN [id number]  PCP:  Sharilyn Sites, MD  Cardiologist:  Sinclair Grooms, MD   Referring MD: Sharilyn Sites, MD   Chief Complaint  Patient presents with   Coronary Artery Disease   Hyperlipidemia   Hypertension    History of Present Illness:    [name] [name] is a 66 y.o. male with a hx of CAD s/p NSTEMI in 2011 tx with BMS to the RCA, Obstructive sleep apnea with CPAP, HTN, HL, obesity,  October 2018 with ACS and was found to have a de novo 95% stenosis in the distal RCA that was treated with drug-eluting stent, tobacco abuse, and hyperlipidemia.  He is not having claudication, neurological symptoms, angina, dyspnea on exertion, palpitations, or other complaints.  He is compliant with medication.  Still smokes cigarettes unfortunately.  Past Medical History:  Diagnosis Date   Coronary artery disease    10/18 PCI/DESx1 dRCA, patent mRCA stent, EF 50-55%   Hypercholesteremia    Hypertension    Myocardial infarction ([place])    10/14/2009    Past Surgical History:  Procedure Laterality Date   CARDIAC CATHETERIZATION     10/14/2012-with stent   [facility]ACT EXTRACTION, BILATERAL     [facility]   CORONARY ANGIOPLASTY  10/15/2011   1 stent   CORONARY STENT INTERVENTION N/A 06/28/2017   Procedure: CORONARY STENT INTERVENTION;  Surgeon: Belva Crome, MD;  Location: [facility];  Service: Cardiovascular;  Laterality: N/A;   LEFT HEART CATH AND CORONARY ANGIOGRAPHY N/A 06/28/2017   Procedure: LEFT HEART CATH AND CORONARY ANGIOGRAPHY;  Surgeon: Belva Crome, MD;  Location: [facility];  Service: Cardiovascular;  Laterality: N/A;    Current Medications: Current Meds  Medication Sig   Ascorbic Acid (VITAMIN C) 500 MG CAPS Take 4 capsules by mouth 2 (two) times daily.    atorvastatin (LIPITOR) 80 MG tablet TAKE 1 TABLET BY MOUTH EVERY DAY AT 6 PM   betamethasone  dipropionate 0.05 % cream APPLY TO THE AFFECTEDNAREA(S) TWICE DAILY AS NEEDED; NOT TO FACE, GROIN, UNDER ARMS.   clopidogrel (PLAVIX) 75 MG tablet TAKE 1 TABLET BY MOUTH EVERY DAY   fluticasone (CUTIVATE) 0.05 % cream Apply 1 application topically daily as needed. (rash)   fluticasone (FLONASE) 50 MCG/ACT nasal spray Place 2 sprays into both nostrils daily as needed for allergies.    icosapent Ethyl (VASCEPA) 1 g capsule Take 2 capsules (2 g total) by mouth 2 (two) times daily.   ketoconazole (NIZORAL) 2 % cream Apply 1 application topically daily as needed. (skin rash)   metoprolol tartrate (LOPRESSOR) 25 MG tablet Take 1 tablet (25 mg total) by mouth 2 (two) times daily.   nitroGLYCERIN (NITROSTAT) 0.4 MG SL tablet Place 1 tablet (0.4 mg total) under the tongue every 5 (five) minutes x 3 doses as needed for chest pain.   OVER THE COUNTER MEDICATION Take 2 capsules by mouth daily.     Allergies:   Patient has no known allergies.   Social History   Socioeconomic History   Marital status: Married    Spouse name: Not on file   Number of children: Not on file   Years of education: Not on file   Highest education level: Not on file  Occupational History   Not on file  Tobacco Use   Smoking status: Light Smoker    Packs/day: 0.50  Years: 30.00    Total pack years: 15.00    Types: Cigarettes   Smokeless tobacco: Never  Vaping Use   Vaping Use: Never used  Substance and Sexual Activity   Alcohol use: Yes    Alcohol/week: 7.0 standard drinks of alcohol    Types: 5 Cans of beer, 2 Shots of liquor per week   Drug use: No   Sexual activity: Yes    Birth control/protection: None  Other Topics Concern   Not on file  Social History Narrative   Not on file   Social Determinants of Health   Financial Resource Strain: Not on file  Food Insecurity: Not on file  Transportation Needs: Not on file  Physical Activity: Not on file  Stress: Not on file  Social Connections: Not on file      Family History: The patient's family history includes CAD in his mother; Cancer in his father; Coronary artery disease (age of onset: 35) in his brother; Stroke (age of onset: 14) in his brother. There is no history of Anesthesia problems, Hypotension, Malignant hyperthermia, or Pseudochol deficiency.  ROS:   Please see the history of present illness.   Psoriasis has been diagnosed.  Rutherford Nail has been recommended.  All other systems reviewed and are negative.  EKGs/Labs/Other Studies Reviewed:    The following studies were reviewed today: No cardiac imaging or intercurrent studies.  EKG:  EKG normal sinus rhythm with normal appearing EKG, unchanged from February 2022.  Recent Labs: No results found for requested labs within last 365 days.  Recent Lipid Panel    Component Value Date/Time   CHOL 101 10/21/2020 1212   TRIG 108 10/21/2020 1212   HDL 30 (L) 10/21/2020 1212   CHOLHDL 3.4 10/21/2020 1212   CHOLHDL 7.6 06/29/2017 0504   VLDL 50 (H) 06/29/2017 0504   LDLCALC 51 10/21/2020 1212    Physical Exam:    VS:  BP 110/62   Pulse 72   Ht _0  (1.727 m)   Wt 242 lb 6.4 oz (110 kg)   SpO2 95%   BMI 36.86 kg/m     Wt Readings from Last 3 Encounters:  02/27/22 242 lb 6.4 oz (110 kg)  10/21/20 237 lb 3.2 oz (107.6 kg)  10/16/19 259 lb 6.4 oz (117.7 kg)     GEN: Morbid obesity. No acute distress HEENT: Normal NECK: No JVD. LYMPHATICS: No lymphadenopathy CARDIAC: No murmur. RRR no gallop, or edema. VASCULAR:  Normal Pulses. No bruits. RESPIRATORY:  Clear to auscultation without rales, wheezing or rhonchi  ABDOMEN: Soft, non-tender, non-distended, No pulsatile mass, MUSCULOSKELETAL: No deformity  SKIN: Warm and dry NEUROLOGIC:  Alert and oriented x 3 PSYCHIATRIC:  Normal affect   ASSESSMENT:    1. Coronary artery disease involving native coronary artery of native heart without angina pectoris   2. Essential hypertension   3. Tobacco abuse   4. Obstructive sleep  apnea   5. Dyslipidemia    PLAN:    In order of problems listed above:  Secondary prevention discussed.  Reiterated the importance of smoking cessation.  He is unable to commit to abstinence. Excellent blood pressure control See #1 above.  We spent greater than 5 minutes discussing the increased risk of PAD, stroke, and recurrent cardiac events. Continue CPAP Continue Vascepa and statin therapy.  C-Met, lipid panel, and A1c ordered today.   Overall education and awareness concerning secondary risk prevention was discussed in detail: LDL less than 70, hemoglobin A1c less than 7,  blood pressure target less than 130/80 mmHg, >150 minutes of moderate aerobic activity per week, avoidance of smoking, weight control (via diet and exercise), and continued surveillance/management of/for obstructive sleep apnea.    Medication Adjustments/Labs and Tests Ordered: Current medicines are reviewed at length with the patient today.  Concerns regarding medicines are outlined above.  No orders of the defined types were placed in this encounter.  No orders of the defined types were placed in this encounter.   There are no Patient Instructions on file for this visit.   Signed, Sinclair Grooms, MD  02/27/2022 8:09 AM    [place]

## 2022-02-27 ENCOUNTER — Ambulatory Visit: Payer: Medicare HMO | Admitting: Interventional Cardiology

## 2022-02-27 ENCOUNTER — Encounter: Payer: Self-pay | Admitting: Interventional Cardiology

## 2022-02-27 VITALS — BP 110/62 | HR 72 | Ht 68.0 in | Wt 242.4 lb

## 2022-02-27 DIAGNOSIS — I10 Essential (primary) hypertension: Secondary | ICD-10-CM

## 2022-02-27 DIAGNOSIS — G4733 Obstructive sleep apnea (adult) (pediatric): Secondary | ICD-10-CM

## 2022-02-27 DIAGNOSIS — E785 Hyperlipidemia, unspecified: Secondary | ICD-10-CM

## 2022-02-27 DIAGNOSIS — Z720 Tobacco use: Secondary | ICD-10-CM

## 2022-02-27 DIAGNOSIS — I2510 Atherosclerotic heart disease of native coronary artery without angina pectoris: Secondary | ICD-10-CM | POA: Diagnosis not present

## 2022-02-27 DIAGNOSIS — Z131 Encounter for screening for diabetes mellitus: Secondary | ICD-10-CM | POA: Diagnosis not present

## 2022-02-27 NOTE — Patient Instructions (Signed)
Medication Instructions:  Your physician recommends that you continue on your current medications as directed. Please refer to the Current Medication list given to you today.  *If you need a refill on your cardiac medications before your next appointment, please call your pharmacy*  Lab Work: TODAY: Lipid panel, Hgb A1c, CMP If you have labs (blood work) drawn today and your tests are completely normal, you will receive your results only by: MyChart Message (if you have MyChart) OR A paper copy in the mail If you have any lab test that is abnormal or we need to change your treatment, we will call you to review the results.  Testing/Procedures: NONE  Follow-Up: At [facility], you and your health needs are our priority.  As part of our continuing mission to provide you with exceptional heart care, we have created designated Provider Care Teams.  These Care Teams include your primary Cardiologist (physician) and Advanced Practice Providers (APPs -  Physician Assistants and Nurse Practitioners) who all work together to provide you with the care you need, when you need it.  Your next appointment:   1 year(s)  The format for your next appointment:   In Person  Provider:   Lesleigh Noe, MD {   Important Information About Sugar

## 2022-02-28 LAB — LIPID PANEL
Chol/HDL Ratio: 3.4 ratio (ref 0.0–5.0)
Cholesterol, Total: 103 mg/dL (ref 100–199)
HDL: 30 mg/dL — ABNORMAL LOW (ref >39)
LDL Chol Calc (NIH): 40 mg/dL (ref 0–99)
Triglycerides: 207 mg/dL — ABNORMAL HIGH (ref 0–149)
VLDL Cholesterol Cal: 33 mg/dL (ref 5–40)

## 2022-02-28 LAB — COMPREHENSIVE METABOLIC PANEL
ALT: 24 IU/L (ref 0–44)
AST: 17 IU/L (ref 0–40)
Albumin/Globulin Ratio: 2 (ref 1.2–2.2)
Albumin: 4.6 g/dL (ref 3.8–4.8)
Alkaline Phosphatase: 64 IU/L (ref 44–121)
BUN/Creatinine Ratio: 20 (ref 10–24)
BUN: 17 mg/dL (ref 8–27)
Bilirubin Total: 0.5 mg/dL (ref 0.0–1.2)
CO2: 22 mmol/L (ref 20–29)
Calcium: 9.8 mg/dL (ref 8.6–10.2)
Chloride: 105 mmol/L (ref 96–106)
Creatinine, Ser: 0.87 mg/dL (ref 0.76–1.27)
Globulin, Total: 2.3 g/dL (ref 1.5–4.5)
Glucose: 103 mg/dL — ABNORMAL HIGH (ref 70–99)
Potassium: 4.4 mmol/L (ref 3.5–5.2)
Sodium: 141 mmol/L (ref 134–144)
Total Protein: 6.9 g/dL (ref 6.0–8.5)
eGFR: 96 mL/min/{1.73_m2} (ref >59)

## 2022-02-28 LAB — HEMOGLOBIN A1C
Est. average glucose Bld gHb Est-mCnc: 111 mg/dL
Hgb A1c MFr Bld: 5.5 % (ref 4.8–5.6)

## 2022-03-18 ENCOUNTER — Other Ambulatory Visit: Payer: Self-pay | Admitting: Interventional Cardiology

## 2022-03-19 ENCOUNTER — Other Ambulatory Visit: Payer: Self-pay | Admitting: Interventional Cardiology

## 2022-03-20 ENCOUNTER — Telehealth: Payer: Self-pay

## 2022-03-20 NOTE — Telephone Encounter (Signed)
**Note De-Identified Karia Ehresman Obfuscation** Levert Mackins (Key: [id number]) Vascepa 1GM capsules   Form [employer] Electronic PA Form (2017 NCPDP)  Determination Message from Plan The patient currently has access to the requested medication and a Prior Authorization is not needed for the patient/medication.  [id number] [place], [place] - [address] AT [facility] (Ph: [phone]) is aware of this outcome.

## 2022-03-28 ENCOUNTER — Other Ambulatory Visit: Payer: Self-pay | Admitting: Interventional Cardiology

## 2023-03-25 ENCOUNTER — Other Ambulatory Visit: Payer: Self-pay

## 2023-03-25 MED ORDER — CLOPIDOGREL BISULFATE 75 MG PO TABS: 75.0000 mg | ORAL_TABLET | Freq: Every day | ORAL | 0 refills | Status: DC

## 2023-03-25 MED ORDER — ICOSAPENT ETHYL 1 G PO CAPS: 2.0000 g | ORAL_CAPSULE | Freq: Two times a day (BID) | ORAL | 0 refills | Status: DC

## 2023-03-25 MED ORDER — METOPROLOL TARTRATE 25 MG PO TABS: 25.0000 mg | ORAL_TABLET | Freq: Two times a day (BID) | ORAL | 0 refills | Status: DC

## 2023-03-25 NOTE — Addendum Note (Signed)
Addended by: Margaret Pyle D on: 03/25/2023 09:04 AM   Modules accepted: Orders

## 2023-03-25 NOTE — Addendum Note (Signed)
Addended by: Margaret Pyle D on: 03/25/2023 03:26 PM   Modules accepted: Orders

## 2023-04-18 ENCOUNTER — Other Ambulatory Visit: Payer: Self-pay | Admitting: Cardiology

## 2023-04-20 ENCOUNTER — Other Ambulatory Visit: Payer: Self-pay

## 2023-04-20 NOTE — Telephone Encounter (Signed)
04/20/2023 Noted refill request for Metoprolol and Plavix. Dr. Katrinka Blazing was patient's cardiologist last seen in July 2023.  Pt has not been assigned another cardiologist as of this date. Pt hasn't seen an APP since 2018.  Discussed with clinic supervisor who suggested scheduling patient with a  provider. Message left to return call to schedule an appt.  Followed up with [facility] supervisor who stated the APP in [place] would want to review the chart prior to authorizing medication refills. Dr. Cristal Deer was available and recommended refill request and situation be forwarded to DOD at [facility].  Refill request forwarded to Dr. Ladona Ridgel and his covering RN. Jim Like MHA RN CCM

## 2023-04-23 ENCOUNTER — Other Ambulatory Visit: Payer: Self-pay | Admitting: Cardiology

## 2023-04-26 DIAGNOSIS — L400 Psoriasis vulgaris: Secondary | ICD-10-CM | POA: Diagnosis not present

## 2023-04-28 ENCOUNTER — Encounter: Payer: Self-pay | Admitting: Cardiology

## 2023-04-28 ENCOUNTER — Telehealth: Payer: Self-pay | Admitting: *Deleted

## 2023-04-28 ENCOUNTER — Ambulatory Visit: Payer: Medicare HMO | Attending: Cardiology | Admitting: Cardiology

## 2023-04-28 VITALS — BP 130/90 | HR 83 | Ht 68.0 in | Wt 225.6 lb

## 2023-04-28 DIAGNOSIS — G4733 Obstructive sleep apnea (adult) (pediatric): Secondary | ICD-10-CM

## 2023-04-28 DIAGNOSIS — I2510 Atherosclerotic heart disease of native coronary artery without angina pectoris: Secondary | ICD-10-CM

## 2023-04-28 DIAGNOSIS — I10 Essential (primary) hypertension: Secondary | ICD-10-CM | POA: Diagnosis not present

## 2023-04-28 DIAGNOSIS — E785 Hyperlipidemia, unspecified: Secondary | ICD-10-CM | POA: Diagnosis not present

## 2023-04-28 DIAGNOSIS — Z79899 Other long term (current) drug therapy: Secondary | ICD-10-CM

## 2023-04-28 MED ORDER — NITROGLYCERIN 0.4 MG SL SUBL: 0.4000 mg | SUBLINGUAL_TABLET | SUBLINGUAL | 2 refills | Status: AC | PRN

## 2023-04-28 NOTE — Patient Instructions (Addendum)
Medication Instructions:  Your physician recommends that you continue on your current medications as directed. Please refer to the Current Medication list given to you today.  *If you need a refill on your cardiac medications before your next appointment, please call your pharmacy*   Lab Work: Please complete a FASTING lipid panel and ALT today in our lab before you leave.  If you have labs (blood work) drawn today and your tests are completely normal, you will receive your results only by: MyChart Message (if you have MyChart) OR A paper copy in the mail If you have any lab test that is abnormal or we need to change your treatment, we will call you to review the results.   Testing/Procedures: Your insurance may require a sleep study in order to get a new cpap machine. This test records several body functions during sleep, including: brain activity, eye movement, oxygen and carbon dioxide blood levels, heart rate and rhythm, breathing rate and rhythm, the flow of air through your mouth and nose, snoring, body muscle movements, and chest and belly movement. Someone will call you to schedule this if it is needed.    Follow-Up: At [facility], you and your health needs are our priority.  As part of our continuing mission to provide you with exceptional heart care, we have created designated Provider Care Teams.  These Care Teams include your primary Cardiologist (physician) and Advanced Practice Providers (APPs -  Physician Assistants and Nurse Practitioners) who all work together to provide you with the care you need, when you need it.  We recommend signing up for the patient portal called "MyChart".  Sign up information is provided on this After Visit Summary.  MyChart is used to connect with patients for Virtual Visits (Telemedicine).  Patients are able to view lab/test results, encounter notes, upcoming appointments, etc.  Non-urgent messages can be sent to your provider as well.    To learn more about what you can do with MyChart, go to [email or web address].    Your next appointment will be dependent on the results of your sleep study/delivery of your cpap machine and it will be with:    Provider:   Dr. Armanda Magic, MD

## 2023-04-28 NOTE — Telephone Encounter (Addendum)
Per Dr Mayford Knife, he initially got supplies at [facility] but does not take Monia Pouch so will need to go to [facility]. OK let patient know that we found his download and he is not having any events on auto CPAP. We can try to order a new auto CPAP from 5-20cm H2O and if insurance declines then get a new split night sleep study. He will need followup with me once he gets his new device   [facility] faxed over his sleep study. Will order his new device with [facility].  Order new auto CPAP from 5-20cm H2O via community message.  CPAP supplies needed: Mask, headgear, cushions, filters, heated tubing and water chamber order placed via community message.  Upon patient request DME selection is [facility] Patient understands he will be contacted by [facility] to set up his cpap. Patient understands to call if [facility] does not contact him with new setup in a timely manner. Patient understands they will be called once confirmation has been received from [facility] that they have received their new machine to schedule 10 week follow up appointment.   [facility] notified of new cpap order  Please add to [facility]

## 2023-04-28 NOTE — Progress Notes (Addendum)
Cardiology Office Note:    Date:  04/28/2023   ID:  [name] [name], DOB 04/21/1956, MRN [id number]  PCP:  [name] Found, MD  Cardiologist:  [name] Noe, MD (Inactive)   Referring MD: [name] Found, MD   Chief Complaint  Patient presents with   Sleep Apnea   Coronary Artery Disease   Hypertension   Hyperlipidemia    History of Present Illness:    [name] [name] is a 67 y.o. male with a hx of CAD s/p NSTEMI in 2014 with cath showing 95% distal RCA stenosis and was tx with BMS to the distal RCA.  He underwent repeat cath in 2018 for CP which showed 40% IRS of the mid RCA stent and 85% dRCA stenosis, 40% mLCx, 40% pRamus, 40% pD1 and 65% mid LAD and underwent PCI of the distal RCA.  He also has a history of obstructive sleep apnea and uses CPAP.  He has not followed with a sleep physician.  He also has a history of HTN, HL, obesity and tobacco abuse.  He walks a lot for exercise.    He is here today for followup and is doing well.  He denies any chest pain or pressure, SOB, DOE, PND, orthopnea, LE edema, dizziness, palpitations or syncope. He is compliant with his meds and is tolerating meds with no SE.    He is doing well with his PAP device and thinks that he has gotten used to it.  He tolerates the mask and feels the pressure is adequate.  Since going on PAP he feels rested in the am and has no significant daytime sleepiness.  He denies any significant mouth or nasal dryness or nasal congestion.  He does not think that he snores.  He tells me that his current device is over 48 years old and would like a new device.   Past Medical History:  Diagnosis Date   Coronary artery disease    10/18 PCI/DESx1 dRCA, patent mRCA stent, EF 50-55%   Hypercholesteremia    Hypertension    Myocardial infarction ([facility])    10/14/2009    Past Surgical History:  Procedure Laterality Date   CARDIAC CATHETERIZATION     10/14/2012-with stent   [facility]AL     [facility]   CORONARY ANGIOPLASTY  10/15/2011   1 stent   CORONARY STENT INTERVENTION N/A 06/28/2017   Procedure: CORONARY STENT INTERVENTION;  Surgeon: Lyn Records, MD;  Location: [facility];  Service: Cardiovascular;  Laterality: N/A;   LEFT HEART CATH AND CORONARY ANGIOGRAPHY N/A 06/28/2017   Procedure: LEFT HEART CATH AND CORONARY ANGIOGRAPHY;  Surgeon: Lyn Records, MD;  Location: [facility];  Service: Cardiovascular;  Laterality: N/A;    Current Medications: Current Meds  Medication Sig   Ascorbic Acid (VITAMIN C) 500 MG CAPS Take 4 capsules by mouth 2 (two) times daily.    atorvastatin (LIPITOR) 80 MG tablet TAKE 1 TABLET BY MOUTH EVERY DAY AT 6 PM   betamethasone dipropionate 0.05 % cream APPLY TO THE AFFECTEDNAREA(S) TWICE DAILY AS NEEDED; NOT TO FACE, GROIN, UNDER ARMS.   clopidogrel (PLAVIX) 75 MG tablet Take 1 tablet (75 mg total) by mouth daily. Pt needs appointment for future refills. Second attempt.   fluticasone (CUTIVATE) 0.05 % cream Apply 1 application topically daily as needed. (rash)   fluticasone (FLONASE) 50 MCG/ACT nasal spray Place 2 sprays into both nostrils daily as needed for allergies.  icosapent Ethyl (VASCEPA) 1 g capsule TAKE 2 CAPSULES BY MOUTH 2 TIMES DAILY.   ketoconazole (NIZORAL) 2 % cream Apply 1 application topically daily as needed. (skin rash)   metoprolol tartrate (LOPRESSOR) 25 MG tablet TAKE 1 TABLET BY MOUTH TWICE A DAY   nitroGLYCERIN (NITROSTAT) 0.4 MG SL tablet Place 1 tablet (0.4 mg total) under the tongue every 5 (five) minutes x 3 doses as needed for chest pain.   OVER THE COUNTER MEDICATION Take 2 capsules by mouth daily.     Allergies:   Patient has no known allergies.   Social History   Socioeconomic History   Marital status: Married    Spouse name: Not on file   Number of children: Not on file   Years of education: Not on file   Highest education level: Not on file  Occupational History   Not on file  Tobacco  Use   Smoking status: Light Smoker    Current packs/day: 0.50    Average packs/day: 0.5 packs/day for 30.0 years (15.0 ttl pk-yrs)    Types: Cigarettes   Smokeless tobacco: Never  Vaping Use   Vaping status: Never Used  Substance and Sexual Activity   Alcohol use: Yes    Alcohol/week: 7.0 standard drinks of alcohol    Types: 5 Cans of beer, 2 Shots of liquor per week   Drug use: No   Sexual activity: Yes    Birth control/protection: None  Other Topics Concern   Not on file  Social History Narrative   Not on file   Social Determinants of Health   Financial Resource Strain: Not on file  Food Insecurity: Not on file  Transportation Needs: Not on file  Physical Activity: Not on file  Stress: Not on file  Social Connections: Not on file     Family History: The patient's family history includes CAD in his mother; Cancer in his father; Coronary artery disease (age of onset: 30) in his brother; Stroke (age of onset: 27) in his brother. There is no history of Anesthesia problems, Hypotension, Malignant hyperthermia, or Pseudochol deficiency.  ROS:   Please see the history of present illness.   Psoriasis has been diagnosed.  [name] [name] has been recommended.  All other systems reviewed and are negative.  EKGs/Labs/Other Studies Reviewed:    The following studies were reviewed today: No cardiac imaging or intercurrent studies.  Recent Labs: No results [name] for requested labs within last 365 days.  Recent Lipid Panel    Component Value Date/Time   CHOL 103 02/27/2022 0828   TRIG 207 (H) 02/27/2022 0828   HDL 30 (L) 02/27/2022 0828   CHOLHDL 3.4 02/27/2022 0828   CHOLHDL 7.6 06/29/2017 0504   VLDL 50 (H) 06/29/2017 0504   LDLCALC 40 02/27/2022 0828    Physical Exam:    VS:  BP (!) 130/90 (BP Location: Left Arm, Patient Position: Sitting, Cuff Size: Large)   Pulse 83   Ht 5\' 8"  (1.727 m)   Wt 225 lb 9.6 oz (102.3 kg)   SpO2 96%   BMI 34.30 kg/m     Wt Readings from Last  3 Encounters:  04/28/23 225 lb 9.6 oz (102.3 kg)  02/27/22 242 lb 6.4 oz (110 kg)  10/21/20 237 lb 3.2 oz (107.6 kg)    GEN: Well nourished, well developed in no acute distress HEENT: Normal NECK: No JVD; No carotid bruits LYMPHATICS: No lymphadenopathy CARDIAC:RRR, no murmurs, rubs, gallops RESPIRATORY:  Clear to auscultation without rales, wheezing  or rhonchi  ABDOMEN: Soft, non-tender, non-distended MUSCULOSKELETAL:  No edema; No deformity  SKIN: Warm and dry NEUROLOGIC:  Alert and oriented x 3 PSYCHIATRIC:  Normal affect  ASSESSMENT:    1. Coronary artery disease involving native coronary artery of native heart without angina pectoris   2. Essential hypertension   3. Dyslipidemia   4. Obstructive sleep apnea     PLAN:    In order of problems listed above:  #CAD -s/p  NSTEMI in 2014 with cath showing 95% distal RCA stenosis and was tx with BMS to the distal RCA. - s/p repeat cath in 2018 for CP which showed 40% IRS of the mid RCA stent and 85% dRCA stenosis, 40% mLCx, 40% pRamus, 40% pD1 and 65% mid LAD and underwent PCI of the distal RCA -He denies any anginal symptoms -Continue prescription drug management with Plavix 75 mg daily, Lopressor 25 mg twice daily and high-dose statin therapy with atorvastatin 80 mg daily with as needed refills  #Hypertension -BP controlled on exam today -Continue prescription drug management with Lopressor 25 mg twice daily with as needed refills  #Hyperlipidemia -LDL goal less than 70 -Continue prescription drug management with atorvastatin 80 mg daily and Vascepa 2 g twice daily with as needed refill -Check FLP and ALT  #OSA  The PAP download performed by his DME was personally reviewed and interpreted by me today and showed an AHI of 0.1/hr on auto CPAP from 4 to 20 cm H2O with 100% compliance in using more than 4 hours nightly. The patient is tolerating PAP therapy well without any problems.  The patient has been using and benefiting  from PAP use and will continue to benefit from therapy.  -his DME no longer takes Monia Pouch (used to use [facility]) so will refer him to [facility] -he wants a new CPAP device so will order a new [employer]Med auto CPAP from 4 to 20cm H2O with heated humidity and mask of choice.  If insurance will not approve it without a sleep study then will get a HST.  Followup after he gets his new device   Medication Adjustments/Labs and Tests Ordered: Current medicines are reviewed at length with the patient today.  Concerns regarding medicines are outlined above.  Orders Placed This Encounter  Procedures   EKG 12-Lead   No orders of the defined types were placed in this encounter.   There are no Patient Instructions on file for this visit.   Signed, Armanda Magic, MD  04/28/2023 2:13 PM    [facility]

## 2023-04-28 NOTE — Addendum Note (Signed)
Addended by: Luellen Pucker on: 04/28/2023 02:25 PM   Modules accepted: Orders

## 2023-04-29 ENCOUNTER — Telehealth: Payer: Self-pay

## 2023-04-29 LAB — LIPID PANEL
Chol/HDL Ratio: 2.9 ratio (ref 0.0–5.0)
Cholesterol, Total: 88 mg/dL — ABNORMAL LOW (ref 100–199)
HDL: 30 mg/dL — ABNORMAL LOW (ref >39)
LDL Chol Calc (NIH): 34 mg/dL (ref 0–99)
Triglycerides: 137 mg/dL (ref 0–149)
VLDL Cholesterol Cal: 24 mg/dL (ref 5–40)

## 2023-04-29 LAB — ALT: ALT: 29 IU/L (ref 0–44)

## 2023-04-29 NOTE — Telephone Encounter (Signed)
-----   Message from Armanda Magic sent at 04/29/2023  9:45 AM EDT ----- Lipids at goal continue current therapy and forward to PCP

## 2023-04-29 NOTE — Telephone Encounter (Signed)
Called to discuss lipids at goal on recent labs. No answer, left detailed message per DPR advising patient to continue current medical therapy. Labs forwarded to PCP.

## 2023-05-10 DIAGNOSIS — G4733 Obstructive sleep apnea (adult) (pediatric): Secondary | ICD-10-CM | POA: Diagnosis not present

## 2023-05-18 ENCOUNTER — Other Ambulatory Visit: Payer: Self-pay | Admitting: Internal Medicine

## 2023-06-09 DIAGNOSIS — G4733 Obstructive sleep apnea (adult) (pediatric): Secondary | ICD-10-CM | POA: Diagnosis not present

## 2023-07-10 DIAGNOSIS — G4733 Obstructive sleep apnea (adult) (pediatric): Secondary | ICD-10-CM | POA: Diagnosis not present

## 2023-08-03 ENCOUNTER — Telehealth: Payer: Self-pay | Admitting: Cardiology

## 2023-08-03 MED ORDER — ATORVASTATIN CALCIUM 80 MG PO TABS: ORAL_TABLET | ORAL | 3 refills | Status: DC

## 2023-08-03 NOTE — Telephone Encounter (Signed)
Pt called in for refill. I noticed there is not future appts schedule or a recall. Please advise

## 2023-08-03 NOTE — Telephone Encounter (Signed)
Atorvastatin reordered per Dr. Norris Cross note from 04/28/23. AVS from that visit states f/u will be set up pending outcome of sleep study and cpap delivery. Note from D. Yetta Barre on 04/28/23 states "let patient know that we found his download and he is not having any events on auto CPAP. We can try to order a new auto CPAP from 5-20cm H2O and if insurance declines then get a new split night sleep study. He will need followup with me once he gets his new device. Patient understands he will be contacted by [facility] to set up his cpap. Patient understands to call if [facility] does not contact him with new setup in a timely manner. Patient understands they will be called once confirmation has been received from [facility] that they have received their new machine to schedule 10 week follow up appointment."  Scheduled patient for sleep follow up 10/08/23 at 1:40 pm.

## 2023-08-03 NOTE — Telephone Encounter (Signed)
*  STAT* If patient is at the pharmacy, call can be transferred to refill team.   1. Which medications need to be refilled? (please list name of each medication and dose if known)   atorvastatin (LIPITOR) 80 MG tablet   2. Which pharmacy/location (including street and city if local pharmacy) is medication to be sent to?  3.  [facility] - [place], [place] - [address] Phone: [phone]  Fax: [phone]    Do they need a 30 day or 90 day supply? 90 Pt is out of medication

## 2023-08-09 DIAGNOSIS — G4733 Obstructive sleep apnea (adult) (pediatric): Secondary | ICD-10-CM | POA: Diagnosis not present

## 2023-08-11 DIAGNOSIS — G4733 Obstructive sleep apnea (adult) (pediatric): Secondary | ICD-10-CM | POA: Diagnosis not present

## 2023-08-18 DIAGNOSIS — G4733 Obstructive sleep apnea (adult) (pediatric): Secondary | ICD-10-CM | POA: Diagnosis not present

## 2023-08-18 DIAGNOSIS — I10 Essential (primary) hypertension: Secondary | ICD-10-CM | POA: Diagnosis not present

## 2023-08-18 DIAGNOSIS — L400 Psoriasis vulgaris: Secondary | ICD-10-CM | POA: Diagnosis not present

## 2023-08-18 DIAGNOSIS — E785 Hyperlipidemia, unspecified: Secondary | ICD-10-CM | POA: Diagnosis not present

## 2023-08-18 DIAGNOSIS — Z008 Encounter for other general examination: Secondary | ICD-10-CM | POA: Diagnosis not present

## 2023-08-18 DIAGNOSIS — B354 Tinea corporis: Secondary | ICD-10-CM | POA: Diagnosis not present

## 2023-08-18 DIAGNOSIS — Z955 Presence of coronary angioplasty implant and graft: Secondary | ICD-10-CM | POA: Diagnosis not present

## 2023-08-18 DIAGNOSIS — Z6833 Body mass index (BMI) 33.0-33.9, adult: Secondary | ICD-10-CM | POA: Diagnosis not present

## 2023-08-18 DIAGNOSIS — E669 Obesity, unspecified: Secondary | ICD-10-CM | POA: Diagnosis not present

## 2023-08-18 DIAGNOSIS — I2510 Atherosclerotic heart disease of native coronary artery without angina pectoris: Secondary | ICD-10-CM | POA: Diagnosis not present

## 2023-08-18 DIAGNOSIS — I499 Cardiac arrhythmia, unspecified: Secondary | ICD-10-CM | POA: Diagnosis not present

## 2023-08-18 DIAGNOSIS — Z720 Tobacco use: Secondary | ICD-10-CM | POA: Diagnosis not present

## 2023-08-18 DIAGNOSIS — Z7902 Long term (current) use of antithrombotics/antiplatelets: Secondary | ICD-10-CM | POA: Diagnosis not present

## 2023-08-27 ENCOUNTER — Telehealth: Payer: Self-pay | Admitting: Cardiology

## 2023-08-27 NOTE — Telephone Encounter (Signed)
 *  STAT* If patient is at the pharmacy, call can be transferred to refill team.   1. Which medications need to be refilled? (please list name of each medication and dose if known) atorvastatin (LIPITOR) 80 MG tablet    2. Would you like to learn more about the convenience, safety, & potential cost savings by using the [facility]?    3. Are you open to using the [facility] (Type [facility].   4. Which pharmacy/location (including street and city if local pharmacy) is medication to be sent to? [id number] - [place], [place] - [address] [facility] [address]    5. Do they need a 30 day or 90 day supply? 90 days   Pt said, he no longer use [facility]. Please resend prescription to correct pharmacy. Thank you!

## 2023-08-30 ENCOUNTER — Other Ambulatory Visit: Payer: Self-pay

## 2023-08-30 MED ORDER — ATORVASTATIN CALCIUM 80 MG PO TABS: ORAL_TABLET | ORAL | 2 refills | Status: DC

## 2023-09-09 DIAGNOSIS — G4733 Obstructive sleep apnea (adult) (pediatric): Secondary | ICD-10-CM | POA: Diagnosis not present

## 2023-10-08 ENCOUNTER — Encounter: Payer: Self-pay | Admitting: Cardiology

## 2023-10-08 ENCOUNTER — Ambulatory Visit: Payer: Medicare HMO | Attending: Cardiology | Admitting: Cardiology

## 2023-10-08 VITALS — BP 120/68 | HR 72 | Ht 69.0 in | Wt 221.0 lb

## 2023-10-08 DIAGNOSIS — E785 Hyperlipidemia, unspecified: Secondary | ICD-10-CM

## 2023-10-08 DIAGNOSIS — I10 Essential (primary) hypertension: Secondary | ICD-10-CM

## 2023-10-08 DIAGNOSIS — G4733 Obstructive sleep apnea (adult) (pediatric): Secondary | ICD-10-CM | POA: Diagnosis not present

## 2023-10-08 DIAGNOSIS — I2510 Atherosclerotic heart disease of native coronary artery without angina pectoris: Secondary | ICD-10-CM

## 2023-10-08 NOTE — Progress Notes (Signed)
 Cardiology Office Note:    Date:  10/08/2023  He ID:  [name] [name], DOB 1956/05/14, MRN [id number]  PCP:  Marvine Rush, MD  Cardiologist:  Victory LELON Claudene DOUGLAS, MD (Inactive)   Referring MD: Marvine Rush, MD   Chief Complaint  Patient presents with   Coronary Artery Disease   Hypertension   Hyperlipidemia   Sleep Apnea    History of Present Illness:    [name] [name] is a 68 y.o. male with a hx of CAD s/p NSTEMI in 2014 with cath showing 95% distal RCA stenosis and was tx with BMS to the distal RCA.  He underwent repeat cath in 2018 for CP which showed 40% IRS of the mid RCA stent and 85% dRCA stenosis, 40% mLCx, 40% pRamus, 40% pD1 and 65% mid LAD and underwent PCI of the distal RCA.  He also has a history of obstructive sleep apnea and uses CPAP.  He also has a history of HTN, HL, obesity and tobacco abuse.  He walks a lot for exercise.    He is here today for followup and is doing well.  He denies any chest pain or pressure, SOB, DOE, PND, orthopnea, LE edema, dizziness, palpitations or syncope. He is compliant with his meds and is tolerating meds with no SE.    He is doing well with his PAP device and thinks that he has gotten used to it.  He tolerates the mask and feels the pressure is adequate.  He is sleeping 7-9 hours nightly without waking up. Since going on PAP he feels rested in the am and has no significant daytime sleepiness.  He denies any significant mouth or nasal dryness or nasal congestion.  He does not think that he snores.    Past Medical History:  Diagnosis Date   Coronary artery disease    10/18 PCI/DESx1 dRCA, patent mRCA stent, EF 50-55%   Hypercholesteremia    Hypertension    Myocardial infarction (HCC)    10/14/2009   OSA on CPAP     Past Surgical History:  Procedure Laterality Date   CARDIAC CATHETERIZATION     10/14/2012-with stent   CATARACT EXTRACTION, BILATERAL     [facility]   CORONARY ANGIOPLASTY  10/15/2011   1 stent   CORONARY  STENT INTERVENTION N/A 06/28/2017   Procedure: CORONARY STENT INTERVENTION;  Surgeon: Claudene Victory LELON, MD;  Location: [facility];  Service: Cardiovascular;  Laterality: N/A;   LEFT HEART CATH AND CORONARY ANGIOGRAPHY N/A 06/28/2017   Procedure: LEFT HEART CATH AND CORONARY ANGIOGRAPHY;  Surgeon: Claudene Victory LELON, MD;  Location: [facility];  Service: Cardiovascular;  Laterality: N/A;    Current Medications: Current Meds  Medication Sig   Ascorbic Acid (VITAMIN C) 500 MG CAPS Take 4 capsules by mouth 2 (two) times daily.    atorvastatin  (LIPITOR ) 80 MG tablet TAKE 1 TABLET BY MOUTH EVERY DAY AT 6 PM   betamethasone dipropionate 0.05 % cream APPLY TO THE AFFECTEDNAREA(S) TWICE DAILY AS NEEDED; NOT TO FACE, GROIN, UNDER ARMS.   clopidogrel  (PLAVIX ) 75 MG tablet Take 1 tablet (75 mg total) by mouth daily. Pt needs appointment for future refills. Second attempt.   fluticasone  (CUTIVATE ) 0.05 % cream Apply 1 application topically daily as needed. (rash)   fluticasone  (FLONASE ) 50 MCG/ACT nasal spray Place 2 sprays into both nostrils daily as needed for allergies.    icosapent  Ethyl (VASCEPA ) 1 g capsule TAKE 2 CAPSULES BY MOUTH 2  TIMES DAILY.   ketoconazole (NIZORAL) 2 % cream Apply 1 application topically daily as needed. (skin rash)   metoprolol  tartrate (LOPRESSOR ) 25 MG tablet TAKE 1 TABLET BY MOUTH TWICE A DAY   nitroGLYCERIN  (NITROSTAT ) 0.4 MG SL tablet Place 1 tablet (0.4 mg total) under the tongue every 5 (five) minutes x 3 doses as needed for chest pain.   OVER THE COUNTER MEDICATION Take 2 capsules by mouth daily.     Allergies:   Patient has no known allergies.   Social History   Socioeconomic History   Marital status: Married    Spouse name: Not on file   Number of children: Not on file   Years of education: Not on file   Highest education level: Not on file  Occupational History   Not on file  Tobacco Use   Smoking status: Light Smoker    Current packs/day: 0.50     Average packs/day: 0.5 packs/day for 30.0 years (15.0 ttl pk-yrs)    Types: Cigarettes   Smokeless tobacco: Never  Vaping Use   Vaping status: Never Used  Substance and Sexual Activity   Alcohol use: Yes    Alcohol/week: 7.0 standard drinks of alcohol    Types: 5 Cans of beer, 2 Shots of liquor per week   Drug use: No   Sexual activity: Yes    Birth control/protection: None  Other Topics Concern   Not on file  Social History Narrative   Not on file   Social Drivers of Health   Financial Resource Strain: Not on file  Food Insecurity: Not on file  Transportation Needs: Not on file  Physical Activity: Not on file  Stress: Not on file  Social Connections: Not on file     Family History: The patient's family history includes CAD in his mother; Cancer in his father; Coronary artery disease (age of onset: 11) in his brother; Stroke (age of onset: 90) in his brother. There is no history of Anesthesia problems, Hypotension, Malignant hyperthermia, or Pseudochol deficiency.  ROS:   Please see the history of present illness.   Psoriasis has been diagnosed.  Otezla has been recommended.  All other systems reviewed and are negative.  EKGs/Labs/Other Studies Reviewed:    The following studies were reviewed today: No cardiac imaging or intercurrent studies.  Recent Labs: 04/28/2023: ALT 29  Recent Lipid Panel    Component Value Date/Time   CHOL 88 (L) 04/28/2023 1429   TRIG 137 04/28/2023 1429   HDL 30 (L) 04/28/2023 1429   CHOLHDL 2.9 04/28/2023 1429   CHOLHDL 7.6 06/29/2017 0504   VLDL 50 (H) 06/29/2017 0504   LDLCALC 34 04/28/2023 1429    Physical Exam:    VS:  BP 120/68   Pulse 72   Ht 5' 9 (1.753 m)   Wt 221 lb (100.2 kg)   BMI 32.64 kg/m     Wt Readings from Last 3 Encounters:  10/08/23 221 lb (100.2 kg)  04/28/23 225 lb 9.6 oz (102.3 kg)  02/27/22 242 lb 6.4 oz (110 kg)     ASSESSMENT:    1. Coronary artery disease involving native coronary artery of  native heart without angina pectoris   2. Essential hypertension   3. Dyslipidemia   4. Obstructive sleep apnea      PLAN:    In order of problems listed above:  #CAD -s/p  NSTEMI in 2014 with cath showing 95% distal RCA stenosis and was tx with BMS to the distal  RCA. - s/p repeat cath in 2018 for CP which showed 40% IRS of the mid RCA stent and 85% dRCA stenosis, 40% mLCx, 40% pRamus, 40% pD1 and 65% mid LAD and underwent PCI of the distal RCA -He has not had anginal symptoms since I saw him last -Continue prescription drug management with Plavix  75 mg daily, Vascepa , metoprolol  25 mg twice daily and atorvastatin  80 mg daily with as needed refills  #Hypertension -BP is well controlled on exam today -Continue drug management with Lopressor  25 mg twice daily with as needed refills  #Hyperlipidemia -LDL goal less than 70 -Continue prescription drug management with atorvastatin  80 mg daily, Vascepa  2 g twice daily with as needed refills -I have personally reviewed and interpreted outside labs performed by patient's PCP which showed LDL 34, HDL 30 and ALT 29 on 04/28/2023  #OSA - The patient is tolerating PAP therapy well without any problems. The PAP download performed by his DME was personally reviewed and interpreted by me today and showed an AHI of 0.3 /hr on auto CPAP 5-20 cm H2O with 100% compliance in using more than 4 hours nightly.  The patient has been using and benefiting from PAP use and will continue to benefit from therapy.   Follow-up in 1 year   Medication Adjustments/Labs and Tests Ordered: Current medicines are reviewed at length with the patient today.  Concerns regarding medicines are outlined above.  No orders of the defined types were placed in this encounter.  No orders of the defined types were placed in this encounter.   There are no Patient Instructions on file for this visit.   Signed, Wilbert Bihari, MD  10/08/2023 1:35 PM    [facility]

## 2023-10-08 NOTE — Patient Instructions (Signed)
 Medication Instructions:  Your physician recommends that you continue on your current medications as directed. Please refer to the Current Medication list given to you today.  *If you need a refill on your cardiac medications before your next appointment, please call your pharmacy*   Lab Work: None.  If you have labs (blood work) drawn today and your tests are completely normal, you will receive your results only by: MyChart Message (if you have MyChart) OR A paper copy in the mail If you have any lab test that is abnormal or we need to change your treatment, we will call you to review the results.   Testing/Procedures: None.   Follow-Up:  Your next appointment:   1 year(s)  Provider:   Dr. Armanda Magic, MD

## 2023-10-10 DIAGNOSIS — G4733 Obstructive sleep apnea (adult) (pediatric): Secondary | ICD-10-CM | POA: Diagnosis not present

## 2023-11-04 ENCOUNTER — Telehealth: Payer: Self-pay | Admitting: Cardiology

## 2023-11-04 MED ORDER — ICOSAPENT ETHYL 1 G PO CAPS: 2.0000 g | ORAL_CAPSULE | Freq: Two times a day (BID) | ORAL | 3 refills | Status: DC

## 2023-11-04 NOTE — Telephone Encounter (Signed)
 Pt's medication was sent to pt's pharmacy as requested. Confirmation received.

## 2023-11-04 NOTE — Telephone Encounter (Signed)
*  STAT* If patient is at the pharmacy, call can be transferred to refill team.   1. Which medications need to be refilled? (please list name of each medication and dose if known) icosapent Ethyl (VASCEPA) 1 g capsule     4. Which pharmacy/location (including street and city if local pharmacy) is medication to be sent to? [id number] - [place], [place] - [address] [facility] [address]     5. Do they need a 30 day or 90 day supply? 90

## 2023-11-07 DIAGNOSIS — G4733 Obstructive sleep apnea (adult) (pediatric): Secondary | ICD-10-CM | POA: Diagnosis not present

## 2023-11-11 DIAGNOSIS — G4733 Obstructive sleep apnea (adult) (pediatric): Secondary | ICD-10-CM | POA: Diagnosis not present

## 2023-11-26 ENCOUNTER — Other Ambulatory Visit (HOSPITAL_COMMUNITY): Payer: Self-pay

## 2023-11-26 ENCOUNTER — Telehealth: Payer: Self-pay | Admitting: Pharmacy Technician

## 2023-11-26 NOTE — Telephone Encounter (Signed)
   NEXT AVAILABLE FILL DATE [id number] LAST FILL DT [id number]

## 2023-12-08 DIAGNOSIS — G4733 Obstructive sleep apnea (adult) (pediatric): Secondary | ICD-10-CM | POA: Diagnosis not present

## 2024-01-07 DIAGNOSIS — G4733 Obstructive sleep apnea (adult) (pediatric): Secondary | ICD-10-CM | POA: Diagnosis not present

## 2024-01-25 ENCOUNTER — Other Ambulatory Visit (HOSPITAL_COMMUNITY): Payer: Self-pay

## 2024-01-25 NOTE — Telephone Encounter (Signed)
 [name] [name]

## 2024-02-07 DIAGNOSIS — G4733 Obstructive sleep apnea (adult) (pediatric): Secondary | ICD-10-CM | POA: Diagnosis not present

## 2024-02-12 DIAGNOSIS — G4733 Obstructive sleep apnea (adult) (pediatric): Secondary | ICD-10-CM | POA: Diagnosis not present

## 2024-03-08 DIAGNOSIS — G4733 Obstructive sleep apnea (adult) (pediatric): Secondary | ICD-10-CM | POA: Diagnosis not present

## 2024-04-08 DIAGNOSIS — G4733 Obstructive sleep apnea (adult) (pediatric): Secondary | ICD-10-CM | POA: Diagnosis not present

## 2024-04-11 ENCOUNTER — Encounter: Payer: Self-pay | Admitting: Cardiology

## 2024-04-11 DIAGNOSIS — E785 Hyperlipidemia, unspecified: Secondary | ICD-10-CM

## 2024-04-11 DIAGNOSIS — I214 Non-ST elevation (NSTEMI) myocardial infarction: Secondary | ICD-10-CM

## 2024-04-11 DIAGNOSIS — I2510 Atherosclerotic heart disease of native coronary artery without angina pectoris: Secondary | ICD-10-CM

## 2024-04-12 ENCOUNTER — Telehealth: Payer: Self-pay | Admitting: Pharmacy Technician

## 2024-04-12 ENCOUNTER — Other Ambulatory Visit (HOSPITAL_COMMUNITY): Payer: Self-pay

## 2024-04-12 NOTE — Telephone Encounter (Signed)
 Pharmacy Patient Advocate Encounter  Insurance verification completed.   The patient is insured through [employer] test claim for Icosapent  1GM. Currently a quantity of 360 is a 90 day supply and the test claim says: .   This test claim was processed through [facility]- copay amounts may vary at other pharmacies due to pharmacy/plan contracts, or as the patient moves through the different stages of their insurance plan.

## 2024-04-13 MED ORDER — ICOSAPENT ETHYL 1 G PO CAPS: 2.0000 g | ORAL_CAPSULE | Freq: Two times a day (BID) | ORAL | 3 refills | Status: AC

## 2024-05-09 DIAGNOSIS — G4733 Obstructive sleep apnea (adult) (pediatric): Secondary | ICD-10-CM | POA: Diagnosis not present

## 2024-05-18 ENCOUNTER — Other Ambulatory Visit: Payer: Self-pay | Admitting: Cardiology

## 2024-05-18 MED ORDER — CLOPIDOGREL BISULFATE 75 MG PO TABS: 75.0000 mg | ORAL_TABLET | Freq: Every day | ORAL | 1 refills | Status: AC

## 2024-05-18 MED ORDER — METOPROLOL TARTRATE 25 MG PO TABS: 25.0000 mg | ORAL_TABLET | Freq: Two times a day (BID) | ORAL | 1 refills | Status: AC

## 2024-06-08 DIAGNOSIS — G4733 Obstructive sleep apnea (adult) (pediatric): Secondary | ICD-10-CM | POA: Diagnosis not present

## 2024-07-09 DIAGNOSIS — G4733 Obstructive sleep apnea (adult) (pediatric): Secondary | ICD-10-CM | POA: Diagnosis not present

## 2024-08-08 DIAGNOSIS — G4733 Obstructive sleep apnea (adult) (pediatric): Secondary | ICD-10-CM | POA: Diagnosis not present

## 2024-10-06 ENCOUNTER — Other Ambulatory Visit: Payer: Self-pay | Admitting: Cardiology
